# Patient Record
Sex: Female | Born: 1945 | Race: Black or African American | Hispanic: No | Smoking: Never smoker
Health system: Southern US, Community
[De-identification: ages and names within clinical notes are randomized; demographics above are authoritative.]

## PROBLEM LIST (undated history)

## (undated) DIAGNOSIS — E119 Type 2 diabetes mellitus without complications: Secondary | ICD-10-CM

## (undated) DIAGNOSIS — I480 Paroxysmal atrial fibrillation: Secondary | ICD-10-CM

## (undated) DIAGNOSIS — I10 Essential (primary) hypertension: Secondary | ICD-10-CM

## (undated) DIAGNOSIS — D649 Anemia, unspecified: Secondary | ICD-10-CM

## (undated) HISTORY — DX: Paroxysmal atrial fibrillation: I48.0

## (undated) HISTORY — DX: Type 2 diabetes mellitus without complications: E11.9

## (undated) HISTORY — DX: Essential (primary) hypertension: I10

## (undated) HISTORY — DX: Anemia, unspecified: D64.9

---

## 2004-06-14 ENCOUNTER — Ambulatory Visit: Payer: Self-pay | Admitting: Internal Medicine

## 2004-06-23 ENCOUNTER — Ambulatory Visit: Payer: Self-pay | Admitting: Internal Medicine

## 2004-06-29 ENCOUNTER — Inpatient Hospital Stay (HOSPITAL_COMMUNITY): Admission: EM | Admit: 2004-06-29 | Discharge: 2004-07-01 | Payer: Self-pay | Admitting: Emergency Medicine

## 2004-06-29 ENCOUNTER — Encounter (INDEPENDENT_AMBULATORY_CARE_PROVIDER_SITE_OTHER): Payer: Self-pay | Admitting: *Deleted

## 2004-06-30 ENCOUNTER — Ambulatory Visit: Payer: Self-pay | Admitting: Internal Medicine

## 2004-07-12 ENCOUNTER — Ambulatory Visit: Payer: Self-pay | Admitting: Internal Medicine

## 2004-07-28 ENCOUNTER — Encounter (INDEPENDENT_AMBULATORY_CARE_PROVIDER_SITE_OTHER): Payer: Self-pay | Admitting: *Deleted

## 2006-07-04 ENCOUNTER — Emergency Department (HOSPITAL_COMMUNITY): Admission: EM | Admit: 2006-07-04 | Discharge: 2006-07-05 | Payer: Self-pay | Admitting: Emergency Medicine

## 2007-12-19 ENCOUNTER — Inpatient Hospital Stay (HOSPITAL_COMMUNITY): Admission: EM | Admit: 2007-12-19 | Discharge: 2007-12-20 | Payer: Self-pay | Admitting: Emergency Medicine

## 2007-12-21 ENCOUNTER — Encounter: Payer: Self-pay | Admitting: Cardiology

## 2008-02-23 ENCOUNTER — Emergency Department (HOSPITAL_COMMUNITY): Admission: EM | Admit: 2008-02-23 | Discharge: 2008-02-23 | Payer: Self-pay | Admitting: Emergency Medicine

## 2008-03-21 ENCOUNTER — Emergency Department (HOSPITAL_COMMUNITY): Admission: EM | Admit: 2008-03-21 | Discharge: 2008-03-21 | Payer: Self-pay | Admitting: Emergency Medicine

## 2009-02-11 ENCOUNTER — Encounter (INDEPENDENT_AMBULATORY_CARE_PROVIDER_SITE_OTHER): Payer: Self-pay | Admitting: *Deleted

## 2009-03-15 ENCOUNTER — Emergency Department (HOSPITAL_COMMUNITY): Admission: EM | Admit: 2009-03-15 | Discharge: 2009-03-15 | Payer: Self-pay | Admitting: Emergency Medicine

## 2009-03-18 ENCOUNTER — Ambulatory Visit: Payer: Self-pay | Admitting: Internal Medicine

## 2009-03-18 DIAGNOSIS — E119 Type 2 diabetes mellitus without complications: Secondary | ICD-10-CM | POA: Insufficient documentation

## 2009-03-18 DIAGNOSIS — D126 Benign neoplasm of colon, unspecified: Secondary | ICD-10-CM

## 2009-03-18 DIAGNOSIS — R197 Diarrhea, unspecified: Secondary | ICD-10-CM

## 2009-03-18 DIAGNOSIS — D509 Iron deficiency anemia, unspecified: Secondary | ICD-10-CM | POA: Insufficient documentation

## 2009-04-17 ENCOUNTER — Telehealth: Payer: Self-pay | Admitting: Internal Medicine

## 2009-04-28 ENCOUNTER — Ambulatory Visit: Payer: Self-pay | Admitting: Internal Medicine

## 2009-04-29 ENCOUNTER — Encounter: Payer: Self-pay | Admitting: Internal Medicine

## 2009-05-13 ENCOUNTER — Telehealth: Payer: Self-pay | Admitting: Internal Medicine

## 2009-06-08 ENCOUNTER — Ambulatory Visit: Payer: Self-pay | Admitting: Internal Medicine

## 2009-06-08 LAB — CONVERTED CEMR LAB
Basophils Relative: 0.3 % (ref 0.0–3.0)
Eosinophils Absolute: 0.2 10*3/uL (ref 0.0–0.7)
Eosinophils Relative: 3.2 % (ref 0.0–5.0)
HCT: 30.3 % — ABNORMAL LOW (ref 36.0–46.0)
Lymphs Abs: 2 10*3/uL (ref 0.7–4.0)
MCHC: 31.6 g/dL (ref 30.0–36.0)
MCV: 81.8 fL (ref 78.0–100.0)
Monocytes Absolute: 0.7 10*3/uL (ref 0.1–1.0)
Neutro Abs: 3.2 10*3/uL (ref 1.4–7.7)
RBC: 3.7 M/uL — ABNORMAL LOW (ref 3.87–5.11)
WBC: 6.1 10*3/uL (ref 4.5–10.5)

## 2009-06-10 ENCOUNTER — Ambulatory Visit: Payer: Self-pay | Admitting: Internal Medicine

## 2010-02-18 ENCOUNTER — Encounter (HOSPITAL_COMMUNITY)
Admission: RE | Admit: 2010-02-18 | Discharge: 2010-04-10 | Payer: Self-pay | Source: Home / Self Care | Attending: Endocrinology | Admitting: Endocrinology

## 2010-04-09 ENCOUNTER — Encounter: Payer: Self-pay | Admitting: Internal Medicine

## 2010-05-11 NOTE — Letter (Signed)
Summary: Patient Notice- Polyp Results  [facility]  [address] [place], [place] [address]   Phone: [phone]  Fax: [phone]        April 29, 2009 MRN: [id number]    Kristi Gonzalez [address] [place], [place]  [address]    Dear [name],  I am pleased to inform you that the colon polyp(s) removed during your recent colonoscopy was (were) found to be benign (no cancer detected) upon pathologic examination. The polyp was however precancerous.  I recommend you have a repeat colonoscopy examination in one years to look for recurrent polyps, as having colon polyps increases your risk for having recurrent polyps or even colon cancer in the future.  Should you develop new or worsening symptoms of abdominal pain, bowel habit changes or bleeding from the rectum or bowels, please schedule an evaluation with either your primary care physician or with me.  Additional information/recommendations:  __ No further action with gastroenterology is needed at this time. Please      follow-up with your primary care physician for your other healthcare      needs.   Please call us if you are having persistent problems or have questions about your condition that have not been fully answered at this time.  Sincerely,  Hilarie Fredrickson MD  This letter has been electronically signed by your physician.  Appended Document: Patient Notice- Polyp Results Letter mailed 1.21.11

## 2010-05-11 NOTE — Assessment & Plan Note (Signed)
Summary: ECLFollowup - anemia   History of Present Illness Visit Type: Follow-up Visit Primary GI MD: Yancey Flemings MD Primary Provider: Assunta Curtis, MD Requesting Provider: Assunta Curtis, MD Chief Complaint: F/U Colonoscopy and EGD, no problems History of Present Illness:   65 year old African American female with hypertension, diabetes mellitus, coronary artery disease, adenomatous colon polyps, and iron deficiency anemia. She was evaluated in the office March 18, 2009 regarding iron deficiency anemia. See that dictation. Hemoglobin levels at that time were 9.3 with an iron saturation of 12.9% and ferritin level of 33.4. She had been on iron therapy and has continued on iron therapy since that time. She did undergo colonoscopy and upper endoscopy on April 28, 2009. 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 As well scattered diverticulosis. Followup colonoscopy in one year recommended. Upper endoscopy was normal. She has continued on iron twice daily. Blood work from June 08, 2009 reveals very little change in hemoglobin, now 9.6. MCV is 81.8. White blood cell count and platelets are normal. She denies any GI complaints. She denies fatigue or exertional dyspnea.. She is now on iron 3 times daily.   GI Review of Systems      Denies abdominal pain, acid reflux, belching, bloating, chest pain, dysphagia with liquids, dysphagia with solids, heartburn, loss of appetite, nausea, vomiting, vomiting blood, weight loss, and  weight gain.        Denies anal fissure, black tarry stools, change in bowel habit, constipation, diarrhea, diverticulosis, fecal incontinence, heme positive stool, hemorrhoids, irritable bowel syndrome, jaundice, light color stool, liver problems, rectal bleeding, and  rectal pain.    Current Medications (verified): 1)  Metformin Hcl 500 Mg Tabs (Metformin Hcl) .Marland Kitchen.. 1 Tablet By Mouth Two Times A Day 2)   Aspirin 81 Mg Tbec (Aspirin) .Marland Kitchen.. 1 Tablet By Mouth Once Daily 3)  Glipizide 10 Mg Xr24h-Tab (Glipizide) .Marland Kitchen.. 1 Tablet By Mouth Once Daily 4)  Benazepril-Hydrochlorothiazide 20-12.5 Mg Tabs (Benazepril-Hydrochlorothiazide) .... One Tablet By Mouth Once Daily 5)  Digoxin 0.125 Mg Tabs (Digoxin) .... One Tablet By Mouth Once Daily 6)  Simvastatin 40 Mg Tabs (Simvastatin) .... One Tablet By Mouth Once Daily 7)  Folic Acid 400 Mcg Tabs (Folic Acid) .... One Tablet By Mouth Once Daily 8)  Nu-Iron 150 Mg Caps (Polysaccharide Iron Complex) .... One Tablet By Mouth Once Daily 9)  Metoprolol Succinate 25 Mg Xr24h-Tab (Metoprolol Succinate) .... One Tablet By Mouth Once Daily 10)  Lantus 100 Unit/ml Soln (Insulin Glargine) .Marland Kitchen.. 10 Units Subcutaneously Q Hs  Allergies (verified): 1)  ! Codeine  Past History:  Past Medical History: Reviewed history from 03/17/2009 and no changes required. Hypertension Diabetes Anemia Coronary Artery Disease Colon Polyps Tubular Adenoma  Past Surgical History: Reviewed history from 03/18/2009 and no changes required. Tubal Ligation  Family History: Reviewed history from 03/18/2009 and no changes required. No FH of Colon Cancer: Family History of Prostate Cancer:Father Family History of Colon Polyps:Mother Family History of Diabetes: Mother and Father Family History of Heart Disease: Father  Social History: Reviewed history from 03/18/2009 and no changes required. Occupation: Retired Widowed 4 childern  Patient has never smoked.  Alcohol Use - no Daily Caffeine Use: soda occ  Illicit Drug Use - no  Review of Systems       The patient complains of anemia.  The patient denies allergy/sinus, anxiety-new, arthritis/joint pain, back pain, blood in urine, breast changes/lumps, change in vision,  confusion, cough, coughing up blood, depression-new, fainting, fatigue, fever, headaches-new, hearing problems, heart murmur, heart rhythm changes, itching,  menstrual pain, muscle pains/cramps, night sweats, nosebleeds, pregnancy symptoms, shortness of breath, skin rash, sleeping problems, sore throat, swelling of feet/legs, swollen lymph glands, thirst - excessive , urination - excessive , urination changes/pain, urine leakage, vision changes, and voice change.    Vital Signs:  Patient profile:   65 year old female Height:      63 inches Weight:      146.13 pounds BMI:     25.98 Pulse rate:   80 / minute Pulse rhythm:   regular BP sitting:   120 / 62  (left arm) Cuff size:   regular  Vitals Entered By: June McMurray CMA Duncan Dull) (June 10, 2009 1:33 PM)  Physical Exam  General:  Well developed, well nourished, no acute distress. Mouth:  No deformity or lesions,  Abdomen:  not reexamined Neurologic:  Alert and  oriented x4;   Psych:  Alert and cooperative. Normal mood and affect.   Impression & Recommendations:  Problem # 1:  ANEMIA, IRON DEFICIENCY, CHRONIC (ICD-280.9) Anemia. Possibly iron deficient. No response to oral iron therapy after several months. Question other cause her causes for anemia. No significant GI mucosal pathology.  Plan: #1. Recommend hematology consultation. The patient declines at this time. She has agreed to stay on iron therapy. She should have a followup CBC at her next appointment with Dr. Evlyn Kanner in a few months. At that time, if she is still anemic, she should be referred to hematology. Dr. Evlyn Kanner can make this referral if the patient is agreeable at that time. She tells me that she would be agreeable.  Problem # 2:  ADENOMATOUS COLONIC POLYP (ICD-211.3) large villous adenoma of the cecum. Removed piecemeal. . Needs followup colonoscopy in one year. She is aware  Patient Instructions: 1)  Please schedule a follow-up appointment as needed.  2)  The medication list was reviewed and reconciled.  All changed / newly prescribed medications were explained.  A complete medication list was provided to the patient /  caregiver. 3)  Copy: Dr. Adrian Prince Patient instructions given to patient prior to leaving.  Milford Cage [facility]  June 10, 2009 1:57 PM

## 2010-05-11 NOTE — Initial Assessments (Signed)
Summary: Consultation   NAME:  Kristi Gonzalez, Kristi Gonzalez               ACCOUNT NO.:  [id number]   MEDICAL RECORD NO.:  [id number]          PATIENT TYPE:  EMS   LOCATION:  ED                           FACILITY:  [facility]   PHYSICIAN:  Iva Boop, M.D. LHCDATE OF BIRTH:  10/21/1945   DATE OF CONSULTATION:  DATE OF DISCHARGE:                                   CONSULTATION   CHIEF COMPLAINT:  Bleeding after colonoscopy.   HISTORY:  This is a pleasant 65 year old African-American woman with a  history of hypertension and diabetes mellitus. She underwent colonoscopy and  EGD by Dr. Yancey Flemings on March 15 because of anemia. She was referred to him  because of a hemoglobin of 10.9 with an MCV of 80 and iron saturation of 10%  with similar numbers in August of 2005. She had no bleeding problems but she  had some mild intermittent constipation and diarrhea. She had a cecal polyp  removed, this was an 11 mm cecal polyp that was sessile removed piecemeal.  She had some erosions in the left colon biopsied and diverticulosis in the  sigmoid. She had an EGD with a small hiatal hernia, duodenal biopsies were  taken to rule out sprue. At about 8:30 on March 20, just last night, she  developed onset of mild cramps followed by hematochezia with bloody stools.  She has had 5 or 6 or those the most recent of which was in the emergency  room with decreased amount she says.  Dr. Lynelle Doctor called me about the patient  and said that she had maroon stool on rectal exam. She denies abdominal  pain, vomiting, presyncope or chest pain at this point.   REVIEW OF SYMPTOMS:  Otherwise negative at this point from what I can tell.  There was no respiratory difficulty or shortness of breath.   PAST MEDICAL HISTORY:  As above plus bilateral tubal ligation.   ALLERGIES:  No known drug allergies.   MEDICATIONS AT HOME:  1.  Iron 65 mg q.d.  2.  Folic acid daily 1 mg.  3.  Avandia 4 mg twice a day.  4.  Benazepril/HCTZ 20/12.5  mg q.d.  5.  Metformin 500 mg b.i.d.  6.  Glipizide 10 mg each day.  7.  Aspirin 81 mg each day is on hold per our protocol.   FAMILY HISTORY:  Mother had colon polyps, there is no colon cancer.   SOCIAL HISTORY:  She is widowed with four daughters. 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 She also works at [employer]. She does not smoke or use alcohol, here with two of her daughters  now.   ADDITIONAL REVIEW OF SYSTEMS:  No history of blood donation, stopped menses  at age 42, all other systems appear negative.   PHYSICAL EXAMINATION:  GENERAL:  Reveals a well appearing, overweight, obese  black woman in no acute distress.  VITAL SIGNS:  Blood pressure is 149/79, pulse 97, temperature 98.1,  respirations 20.  HEENT:  Eyes anicteric.  NECK:  Supple.  CHEST:  Clear.  HEART:  S1, S1, no rubs or gallops.  ABDOMEN:  Soft, nontender, no organomegaly. Bowel sounds mildly increased  perhaps.  RECTAL:  Not repeated.  EXTREMITIES:  Without edema.  NEUROLOGIC:  She is alert and oriented x3.  SKIN:  In areas inspected free of rash.   LABORATORY DATA:  Her hemoglobin this morning is 10.5 with hematocrit 32.8%,  MCV is 80.2, platelet count 300.  White count 7.4, PTT is prolonged at 45  seconds with top normal 37. Her pro time/INR is normal at 0.9 with a pro  time of 12.4 seconds. CMET is normal except for glucose of 211.   ASSESSMENT:  1.  Gastrointestinal bleeding after polypectomy. Possibility of diverticular      bleed exists but most likely she is having bleeding from the cecal      polypectomy site.  2.  Diabetes mellitus not controlled at this point with blood sugar 211.  3.  Hypertension.  4.  Prolonged PTT, uncertain etiology, probably nonspecific and laboratory      error would be my best guess but she could potentially have some sort of      undiagnosed coagulopathy.   PLAN:  1.  Admit the patient, hydrate her, transfuse if needed. The  risks are      reviewed with the patient and the daughter that include transfusion      reaction, hepatitis and AIDS.  Recheck PTT, might need a mixing study.  2.  If she has persistent bleeding, a colonoscopy might be indicated.  3.  Hold her antihypertensives but give oral diabetic agents and check blood      sugars. May need sliding scale. Since she is going to be on clear      liquids, I would not give her the sliding scale at this point but we      will see what her blood sugars do and add that during the day if needed.      May need sliding coverage as well, will order that in case she needs it.   I appreciate the opportunity to care for this patient.      CEG/MEDQ  D:  06/29/2004  T:  06/29/2004  Job:  [id number]   cc:   Wilhemina Bonito. Marina Goodell, M.D. [facility]   Jeannett Senior A. Evlyn Kanner, M.D.  [address]  [place]  [place] [address]  Fax: [phone]

## 2010-05-11 NOTE — Procedures (Signed)
Summary: Upper Endoscopy  Patient: Kristi Gonzalez Note: All result statuses are Final unless otherwise noted.  Tests: (1) Upper Endoscopy (EGD)   EGD Upper Endoscopy       DONE     [facility]     [address]     [place], [place]  [address]           ENDOSCOPY PROCEDURE REPORT           PATIENT:  Kristi Gonzalez, Kristi Gonzalez  MR#:  [id number]     BIRTHDATE:  1945-11-08, 63 yrs. old  GENDER:  female           ENDOSCOPIST:  Wilhemina Bonito. Eda Keys, MD     Referred by:  Adrian Prince, M.D.           PROCEDURE DATE:  04/28/2009     PROCEDURE:  EGD, diagnostic     ASA CLASS:  Class II     INDICATIONS:  iron deficiency anemia           MEDICATIONS:   There was residual sedation effect present from     prior procedure., Versed 2 mg IV     TOPICAL ANESTHETIC:  Exactacain Spray           DESCRIPTION OF PROCEDURE:   After the risks benefits and     alternatives of the procedure were thoroughly explained, informed     consent was obtained.  The LB GIF-H180 [id number] endoscope was     introduced through the mouth and advanced to the third portion of     the duodenum, without limitations.  The instrument was slowly     withdrawn as the mucosa was fully examined.     <<PROCEDUREIMAGES>>           The upper, middle, and distal third of the esophagus were     carefully inspected and no abnormalities were noted. The z-line     was well seen at the GEJ. The endoscope was pushed into the fundus     which was normal including a retroflexed view. The antrum,gastric     body, first and second part of the duodenum were unremarkable.     Retroflexed views revealed no abnormalities.    The scope was then     withdrawn from the patient and the procedure completed.           COMPLICATIONS:  None           ENDOSCOPIC IMPRESSION:     1) Normal EGD     RECOMMENDATIONS:     1) continue iron DAILY     2) Call office next 2-3 days to schedule an office appointment     for 8 weeks     3) Have a CBC (blood  count) drawn a few days prior to your office     visit           ______________________________     Wilhemina Bonito. Eda Keys, MD           CC:  Adrian Prince, MD, The Patient           n.     eSIGNED:   Wilhemina Bonito. Eda Keys at 04/28/2009 11:45 AM           Tobie Lords, [id number]  Note: An exclamation mark (!) indicates a result that was not dispersed into the flowsheet. Document Creation Date: 04/28/2009 11:45 AM _______________________________________________________________________  (1) Order result status: Final Collection  or observation date-time: 04/28/2009 11:39 Requested date-time:  Receipt date-time:  Reported date-time:  Referring Physician:   Ordering Physician: Fransico Setters [phone]) Specimen Source:  Source: Launa Grill Order Number: [id number] Lab site:

## 2010-05-11 NOTE — Procedures (Signed)
Summary: Colonoscopy  Patient: Kristi Gonzalez Note: All result statuses are Final unless otherwise noted.  Tests: (1) Colonoscopy (COL)   COL Colonoscopy           DONE     [facility]     [address]     [place], [place]  [address]           COLONOSCOPY PROCEDURE REPORT           PATIENT:  Kristi, Gonzalez  MR#:  [id number]     BIRTHDATE:  19-Apr-1945, 63 yrs. old  GENDER:  female           ENDOSCOPIST:  Wilhemina Bonito. Eda Keys, MD     Referred by:  Adrian Prince, M.D.           PROCEDURE DATE:  04/28/2009     PROCEDURE:  Colonoscopy with snare polypectomy     ASA CLASS:  Class II     INDICATIONS:  Iron Deficiency Anemia ; overdue for surveillance           MEDICATIONS:   Fentanyl 75 mcg IV, Versed 7 mg IV           DESCRIPTION OF PROCEDURE:   After the risks benefits and     alternatives of the procedure were thoroughly explained, informed     consent was obtained.  Digital rectal exam was performed and     revealed no abnormalities.   The LB CF-H180AL [id number] endoscope     was introduced through the anus and advanced to the cecum, which     was identified by both the appendix and ileocecal valve, without     limitations. Time to cecum = 3:11 min.The quality of the prep was     good, using MoviPrep.  The instrument was then slowly withdrawn     (t=20:40min) as the colon was fully examined.     <<PROCEDUREIMAGES>>           FINDINGS:  A29mm sessile polyp was found in the cecum adjacent to     the appendiceal orifice. Polyp was snared piecemeal, then     cauterized with monopolar cautery. Retrieval was successful.     Mild diverticulosis was found found scattered throught the colon.     Retroflexed views in the rectum revealed no abnormalities.    The     scope was then withdrawn from the patient and the procedure     completed.           COMPLICATIONS:  None           ENDOSCOPIC IMPRESSION:     1) Sessile polyp in the cecum (from prior polypectomy site) -     removed  piecemeal     2) Mild diverticulosis found scattered throught the colon     RECOMMENDATIONS:     1) Follow up colonoscopy in 1 year     2) EGD today           ______________________________     Wilhemina Bonito. Eda Keys, MD           CC:  Adrian Prince, MD; The Patient           n.     eSIGNED:   Wilhemina Bonito. Eda Keys at 04/28/2009 11:34 AM           Tobie Lords, [id number]  Note: An exclamation mark (!) indicates a result that was not dispersed into the flowsheet. Document  Creation Date: 04/28/2009 11:34 AM _______________________________________________________________________  (1) Order result status: Final Collection or observation date-time: 04/28/2009 11:23 Requested date-time:  Receipt date-time:  Reported date-time:  Referring Physician:   Ordering Physician: Fransico Setters [phone]) Specimen Source:  Source: Launa Grill Order Number: [id number] Lab site:   Appended Document: Colonoscopy     Procedures Next Due Date:    Colonoscopy: 04/2010

## 2010-05-11 NOTE — Discharge Summary (Signed)
Summary: Discharge Summary   NAME:  Kristi Gonzalez, Kristi Gonzalez               ACCOUNT NO.:  [id number]   MEDICAL RECORD NO.:  [id number]          PATIENT TYPE:  INP   LOCATION:  0452                         FACILITY:  [facility]   PHYSICIAN:  Wilhemina Bonito. Marina Goodell, M.D. [facility] OF BIRTH:  07/11/1945   DATE OF ADMISSION:  06/29/2004  DATE OF DISCHARGE:  07/01/2004                                 DISCHARGE SUMMARY   ADMITTING DIAGNOSES:  1.  Acute lower gastrointestinal bleed, post polypectomy consistent with      post polypectomy hemorrhage.  2.  Diabetes mellitus.  3.  Hypertension.  4.  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   DISCHARGE DIAGNOSES:  1.  Resolved post polypectomy bleed.  2.  Anemia secondary to above.  3.  Diabetes mellitus.  4.  Hypertension.   CONSULTATIONS:  None.   PROCEDURES:  None.   BRIEF HISTORY:  Kristi Gonzalez is a 65 year old [place] American female with history  as described above.  She had undergone a colonoscopy and upper endoscopy  with Dr. Marina Goodell, on June 23, 2004, due to a finding of anemia.  Her  hemoglobin at the time of referral was 10.9 with an MCV of 80 and an iron  saturation of 10%.  She had no obvious bleeding problems but had had some  intermittent constipation and diarrhea.  She did have a cecal polyp removed,  11-mm, at the time of colonoscopy and this was removed piecemeal.  She was  also noted to have some erosions in the left colon which were biopsied and  sigmoid diverticulosis.  EGD showed only a hiatal hernia and random duodenal  biopsies were taken to rule out sprue.  At about 8:30 in the evening on  June 28, 2004, she developed onset of abdominal cramping, followed by  hematochezia with grossly bloody stools.  She had 5-6 episodes at home and  then presented to the emergency room, was seen and evaluated by the ER  physician, GI was then called and she is admitted at this time with a post  polypectomy bleed.  She was hemodynamically stable on admission with  hemoglobin of 10.5.   LABORATORY STUDIES:  On June 29, 2004, again WBC was 7.4, hemoglobin 10.5,  hematocrit of 32.8, MCV of 80.  Serial values were obtained.  Later that  same day, hemoglobin 9, hematocrit of 27.9.  Followup on June 30, 2004,  hemoglobin 9.1, hematocrit of 28.5, and on July 01, 2004, hemoglobin 10.1,  hematocrit of 31.2.  Pro time 12.4, INR of 0.9, PTT of 45.  This was  repeated and was still elevated at 40.  Electrolytes within normal limits.  BUN 10, creatinine 0.6, glucose was 211 on admission.  Liver functions  normal.   HOSPITAL COURSE:  The patient was admitted by Dr. Leone Payor who was covering  the GI service.  She was placed at bedrest on a clear liquid diet, started  on IV fluids, and serial H&H's were obtained.  On the morning of June 29, 2004, she had no complaints of abdominal pain, was still having some  bleeding, but the  interval between episodes was increasing.  We continued to  watch her closely.  Her hemoglobin stabilized and she did not require  transfusion.  By June 30, 2004, her bleeding had completely stopped.  She  had not had any further stools for over 12 hours and hemoglobin was 9.  She  was placed on a full liquid diet, ambulated a bit, and on July 01, 2004,  continued to do well with a stable hemoglobin at 9.1 and no further stools.  It was felt that her bleeding had resolved.  She was allowed discharge to  home in a stable and improved condition to follow up with Dr. Marina Goodell on July 12, 2004 at 11:00 a.m. and to call for any problems in the interim.   MEDICATIONS ON DISCHARGE:  1.  She was to hold her aspirin until July 19, 2004.  Use on aspirin or      anti-inflammatories.  2.  Resume her other medications including iron 65 mg daily.  3.  Folic acid 1 mg daily.  4.  Avandia 4 mg b.i.d.  5.  Benazepril/HCTZ 20/12.5 daily.  6.  Metformin 500 b.i.d.  7.  Glipizide 10 mg daily.   DIET:  Low residue x 2 weeks.   CONDITION ON  DISCHARGE:  Stable and improved.      AE/MEDQ  D:  07/28/2004  T:  07/28/2004  Job:  [id number]   cc:   Wilhemina Bonito. Marina Goodell, M.D. [facility]   Jeannett Senior A. Evlyn Kanner, M.D.  [address]  [place]  [place] [address]  Fax: [phone]

## 2010-05-11 NOTE — Progress Notes (Signed)
Summary: Was recently put on insulin-sched for [facility]  Phone Note Call from Patient Call back at Home Phone [phone]   Call For: Dr Marina Goodell Summary of Call: Appt on the 18th for [facility] and has been recently put on insulin. Initial call taken by: Leanor Kail [facility],  April 17, 2009 12:22 PM  Follow-up for Phone Call        Since the patient's office visit she has been started on Lantus Insulin 10 u daily.  Dr Marina Goodell are you ok with the patient proceeding with her endo/colon as scheduled on 04/28/09.  Patient 's oral diabetes meds are unchanged.  Dr Marina Goodell please advise. Follow-up by: Darcey Nora RN, CGRN,  April 17, 2009 1:23 PM  Additional Follow-up for Phone Call Additional follow up Details #1::        When does she take the Lantus? If at night, give 1/2 dose. If in am, hold the insulin until after the procedures. Thanks Additional Follow-up by: Hilarie Fredrickson MD,  April 17, 2009 2:51 PM    Additional Follow-up for Phone Call Additional follow up Details #2::    Patient  states she takes her lantus in the am, she is instructed to hold it on 04/28/09 Follow-up by: Darcey Nora RN, CGRN,  April 17, 2009 3:21 PM  New/Updated Medications: LANTUS 100 UNIT/ML SOLN (INSULIN GLARGINE) 10 units subcutaneously q hs

## 2010-05-11 NOTE — Progress Notes (Signed)
Summary: labwork?  Phone Note Call from Patient Call back at [facility] Phone [phone]   Caller: Patient Call For: Dr. Marina Goodell Reason for Call: Talk to Nurse Summary of Call: pt sch'ed 3 mo f/u from [facility] and thinks she is also to have labwork before this follow up...  nothing in IDX Initial call taken by: Vallarie Mare,  May 13, 2009 3:24 PM  Follow-up for Phone Call        Pt. ntfd. that I put labs  in the computer for 1-2 days prior to appt. on 06/10/09. Follow-up by: Teryl Lucy RN,  May 14, 2009 9:26 AM

## 2010-05-13 NOTE — Letter (Signed)
Summary: Colonoscopy Letter  [facility]  [address] [place], [place] [address]   Phone: [phone]  Fax: [phone]      April 09, 2010 MRN: [id number]   Kristi Gonzalez [address] [place], [place]  [address]   Dear [name],   According to your medical record, it is time for you to schedule a Colonoscopy. The American Cancer Society recommends this procedure as a method to detect early colon cancer. Patients with a family history of colon cancer, or a personal history of colon polyps or inflammatory bowel disease are at increased risk.  This letter has been generated based on the recommendations made at the time of your procedure. If you feel that in your particular situation this may no longer apply, please contact our office.  Please call our office at [phone] to schedule this appointment or to update your records at your earliest convenience.  Thank you for cooperating with [place] to provide you with the very best care possible.   Sincerely,  Wilhemina Bonito. Marina Goodell, M.D.  [facility] [phone]

## 2010-06-27 LAB — GLUCOSE, CAPILLARY: Glucose-Capillary: 87 mg/dL (ref 70–99)

## 2010-07-13 LAB — CBC
HCT: 31.3 % — ABNORMAL LOW (ref 36.0–46.0)
Hemoglobin: 10 g/dL — ABNORMAL LOW (ref 12.0–15.0)
RBC: 3.86 MIL/uL — ABNORMAL LOW (ref 3.87–5.11)
RDW: 14.5 % (ref 11.5–15.5)
WBC: 6.5 10*3/uL (ref 4.0–10.5)

## 2010-07-13 LAB — COMPREHENSIVE METABOLIC PANEL
ALT: 14 U/L (ref 0–35)
Alkaline Phosphatase: 46 U/L (ref 39–117)
Chloride: 100 mEq/L (ref 96–112)
Glucose, Bld: 95 mg/dL (ref 70–99)
Potassium: 3.7 mEq/L (ref 3.5–5.1)
Sodium: 135 mEq/L (ref 135–145)
Total Bilirubin: 0.4 mg/dL (ref 0.3–1.2)
Total Protein: 7.7 g/dL (ref 6.0–8.3)

## 2010-07-13 LAB — DIFFERENTIAL
Basophils Absolute: 0 10*3/uL (ref 0.0–0.1)
Basophils Relative: 1 % (ref 0–1)
Eosinophils Absolute: 0.1 10*3/uL (ref 0.0–0.7)
Monocytes Absolute: 0.7 10*3/uL (ref 0.1–1.0)
Monocytes Relative: 11 % (ref 3–12)
Neutrophils Relative %: 55 % (ref 43–77)

## 2010-07-13 LAB — GLUCOSE, CAPILLARY: Glucose-Capillary: 84 mg/dL (ref 70–99)

## 2010-08-24 NOTE — H&P (Signed)
NAME:  Kristi Gonzalez, Kristi Gonzalez               ACCOUNT NO.:  [id number]   MEDICAL RECORD NO.:  [id number]          PATIENT TYPE:  INP   LOCATION:  3736                         FACILITY:  [facility]   PHYSICIAN:  Cassell Clement, M.D. DATE OF BIRTH:  1946/02/03   DATE OF ADMISSION:  12/19/2007  DATE OF DISCHARGE:                              HISTORY & PHYSICAL   CHIEF COMPLAINT:  Rapid irregular heart rate.   HISTORY:  This is a 65 year old African American woman, who developed  sudden rapid atrial fibrillation this morning while working.  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 when she  suddenly felt weak and felt her heart racing in her chest and had near  syncope.  She was also dyspneic, but denied any chest pain.  She does  not have any history of coronary artery disease.  She has had diabetes  for more than 10 years and hypertension for more than 10 years.  She had  a negative stress test in 2001.  She has not had an echo as far as she  knows.  She does not have any history of thyroid problems.  She has had  a past history of chronic iron-deficiency anemia and has been on iron  which was recently adjusted by Dr. Evlyn Kanner.  In June 23, 2004, she  underwent esophagogastroduodenoscopy and colonoscopy by Dr. Yancey Flemings  with removal of a sessile polyp in the cecum at that time.   HOME MEDICATIONS:  1. Simvastatin 40 mg daily.  2. Metformin 1000 mg half in the morning, one in the evening.  3. Glipizide 10 mg daily.  4. Avandia 8 mg daily.  5. Ferrex 150 mg cap daily.  6. Benazepril/hydrochlorothiazide 20/12.5 daily.  7. Senokot p.r.n.Marland Kitchen   FAMILY HISTORY:  Mother is still living at age 2 and has diabetes and  hypertension.  Father died at 87 of heart problems.   SOCIAL HISTORY:  Reveals that she is a widow.  She works two jobs.  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  She  does  not use alcohol or tobacco.   ALLERGIES:  She is allergic to CODEINE.   PREVIOUS SURGERY:  Tubal ligation.   REVIEW OF SYSTEMS:  GI:  Reveals that she has had dark stools  attributable to iron.  She has not had any gross hematochezia.  She  denies any dysuria and she has nocturia x3.  She denies cough or sputum  production.  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  Remainder  of review of systems negative in detail.   PHYSICAL EXAMINATION:  VITAL SIGNS:  Her blood pressure is 130/70, pulse  is now 74 and is normal sinus rhythm.  In the emergency room, she was  found to be in atrial fib with a rate of 130 and had been placed on IV  Cardizem drip and when she arrived on 3700, she was found to be in sinus  rhythm.  HEENT:  Unremarkable.  Chest:  Clear.  HEART:  No murmur, gallop, rub, or click.  ABDOMEN:  Soft and nontender.  Liver and spleen not enlarged.  EXTREMITIES:  No phlebitis.  No edema.  Pedal pulses present.   Her initial EKG is not available for review, but it is the one that  showed atrial fib.  The EKG available for review is one done on 3700  showing normal sinus rhythm and no ischemic changes.  Her chest x-ray  shows borderline cardiomegaly in a portable film, but no overt CHF.  Her  B-type natriuretic peptide is normal at 35, hemoglobin is 9.7,  hematocrit 30.7.  Electrolytes normal with a BUN of 14, creatinine of  0.8.   IMPRESSION:  1. Paroxysmal atrial fibrillation resolved after brief IV Cardizem      therapy.  2. Anemia, chronic by history.  3. Mild cardiomegaly probably secondary to her anemia and her history      of hypertension.  4. Diabetes mellitus type 2.   DISPOSITION:  The patient has been admitted to 3700.  We have stopped  her IV Cardizem.  We are going to start low-dose beta-blocker and 81 mg  aspirin.  Anticipate possible discharge tomorrow morning if she remains  stable overnight and then we can get her two-dimensional  echocardiogram  as an outpatient.   Thank you for the opportunity for sharing in this pleasant woman's care.           ______________________________  Cassell Clement, M.D.     TB/MEDQ  D:  12/19/2007  T:  12/20/2007  Job:  [id number]   cc:   Jeannett Senior A. Evlyn Kanner, M.D.  Wilhemina Bonito. Marina Goodell, MD

## 2010-08-24 NOTE — Discharge Summary (Signed)
NAME:  Kristi Gonzalez, Kristi Gonzalez               ACCOUNT NO.:  [id number]   MEDICAL RECORD NO.:  [id number]          PATIENT TYPE:  INP   LOCATION:  3736                         FACILITY:  [facility]   PHYSICIAN:  Cassell Clement, M.D. DATE OF BIRTH:  [birth year]   DATE OF ADMISSION:  12/19/2007  DATE OF DISCHARGE:  12/20/2007                               DISCHARGE SUMMARY   FINAL DIAGNOSES:  1. Paroxysmal atrial fibrillation, resolved.  2. Diabetes mellitus.  3. Iron-deficiency anemia.  4. Mild cardiomegaly on portable x-ray.   OPERATIONS PERFORMED:  None.   HISTORY:  This is a 65 year old African American woman who developed  sudden rapid atrial fib while working at the [employer] where she  works in [employer].  She nearly fainted and was aware of rapid  irregular palpitations.  She had syncope, but no chest pain.  She has a  past history of longstanding diabetes and hypertension, but has not had  known coronary disease and had negative stress test in 2001.  She has  not had a prior echo.  She does not have any history of known thyroid  problems.  She has had a history of iron-deficiency anemia.  She did  have upper endoscopy and colonoscopy by Dr. Marina Goodell, on June 23, 2004,  with removal of sessile polyp at that time.   PHYSICAL EXAMINATION:  VITAL SIGNS:  Her weight was 130/70, pulse when  she came to the emergency room was rapid and irregular and she was  placed on IV Cardizem drip.  When she arrived on 3700 floor, she had  converted back to normal sinus rhythm at 74/min and respirations are  normal.  HEENT:  Negative.  CHEST:  Clear.  HEART:  No murmur, gallop, or rub.  ABDOMEN:  Negative.  EXTREMITIES:  No phlebitis or edema.   Her EKG after conversion to sinus rhythm shows no acute changes.  Chest  x-ray shows mild cardiomegaly with no CHF.   LABORATORY DATA:  Initially showed hemoglobin 9.7, the following day was  up to 10.1.  Electrolytes were normal.  B-natriuretic peptide  normal at  35.  Point-of-care cardiac enzymes were normal.   HOSPITAL COURSE:  The patient was taken off her IV Cardizem after  arrival on 3700.  When it was seen that she was back in sinus rhythm,  she was begun on low-dose beta-blocker and 25 mg of Toprol daily, and  was continued on daily aspirin.  She remained in sinus rhythm overnight  and is being discharged the following morning.  Echocardiogram could not  be done on admission in a timely fashion and so we will do that as an  outpatient.   DISCHARGE MEDICATIONS:  1. Toprol-XL 25 one daily.  2. Aspirin 81 mg daily.  3. Simvastatin 40 mg daily.  4. Metformin 1000 mg tablets taking half in the morning, whole at      night.  5. Glipizide 10 mg half tablet daily.  6. Avandia 8 mg half tablet daily.  7. Ferrex 150 one twice a day.  8. Benazepril and hydrochlorothiazide 20/12.5 daily.   She  will remain on diabetic diet.  She is to try to lose weight.  She is  to avoid caffeine.  We will arrange for an outpatient echo in our  office.  Anticipate she will return to work on December 24, 2007, and  we gave her a note to that effect.   CONDITION ON DISCHARGE:  Improved.           ______________________________  Cassell Clement, M.D.     TB/MEDQ  D:  12/20/2007  T:  12/20/2007  Job:  [id number]   cc:   Jeannett Senior A. Evlyn Kanner, M.D.

## 2010-08-27 NOTE — Discharge Summary (Signed)
NAME:  Kristi Gonzalez, Kristi Gonzalez               ACCOUNT NO.:  [id number]   MEDICAL RECORD NO.:  [id number]          PATIENT TYPE:  INP   LOCATION:  0452                         FACILITY:  [facility]   PHYSICIAN:  Wilhemina Bonito. Marina Goodell, M.D. [facility] OF BIRTH:  November 12, 1945   DATE OF ADMISSION:  06/29/2004  DATE OF DISCHARGE:  07/01/2004                                 DISCHARGE SUMMARY   ADMITTING DIAGNOSES:  1.  Acute lower gastrointestinal bleed, post polypectomy consistent with      post polypectomy hemorrhage.  2.  Diabetes mellitus.  3.  Hypertension.  4.  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   DISCHARGE DIAGNOSES:  1.  Resolved post polypectomy bleed.  2.  Anemia secondary to above.  3.  Diabetes mellitus.  4.  Hypertension.   CONSULTATIONS:  None.   PROCEDURES:  None.   BRIEF HISTORY:  Kristi Gonzalez is a 65 year old [place] American female with history  as described above.  She had undergone a colonoscopy and upper endoscopy  with Dr. Marina Goodell, on June 23, 2004, due to a finding of anemia.  Her  hemoglobin at the time of referral was 10.9 with an MCV of 80 and an iron  saturation of 10%.  She had no obvious bleeding problems but had had some  intermittent constipation and diarrhea.  She did have a cecal polyp removed,  11-mm, at the time of colonoscopy and this was removed piecemeal.  She was  also noted to have some erosions in the left colon which were biopsied and  sigmoid diverticulosis.  EGD showed only a hiatal hernia and random duodenal  biopsies were taken to rule out sprue.  At about 8:30 in the evening on  June 28, 2004, she developed onset of abdominal cramping, followed by  hematochezia with grossly bloody stools.  She had 5-6 episodes at home and  then presented to the emergency room, was seen and evaluated by the ER  physician, GI was then called and she is admitted at this time with a post  polypectomy bleed.  She was hemodynamically stable on admission with  hemoglobin of 10.5.   LABORATORY STUDIES:  On June 29, 2004, again WBC was 7.4, hemoglobin 10.5,  hematocrit of 32.8, MCV of 80.  Serial values were obtained.  Later that  same day, hemoglobin 9, hematocrit of 27.9.  Followup on June 30, 2004,  hemoglobin 9.1, hematocrit of 28.5, and on July 01, 2004, hemoglobin 10.1,  hematocrit of 31.2.  Pro time 12.4, INR of 0.9, PTT of 45.  This was  repeated and was still elevated at 40.  Electrolytes within normal limits.  BUN 10, creatinine 0.6, glucose was 211 on admission.  Liver functions  normal.   HOSPITAL COURSE:  The patient was admitted by Dr. Leone Payor who was covering  the GI service.  She was placed at bedrest on a clear liquid diet, started  on IV fluids, and serial H&H's were obtained.  On the morning of June 29, 2004, she had no complaints of abdominal pain, was still having some  bleeding, but the interval between episodes was increasing.  We continued to  watch her closely.  Her hemoglobin stabilized and she did not require  transfusion.  By June 30, 2004, her bleeding had completely stopped.  She  had not had any further stools for over 12 hours and hemoglobin was 9.  She  was placed on a full liquid diet, ambulated a bit, and on July 01, 2004,  continued to do well with a stable hemoglobin at 9.1 and no further stools.  It was felt that her bleeding had resolved.  She was allowed discharge to  home in a stable and improved condition to follow up with Dr. Marina Goodell on July 12, 2004 at 11:00 a.m. and to call for any problems in the interim.   MEDICATIONS ON DISCHARGE:  1.  She was to hold her aspirin until July 19, 2004.  Use on aspirin or      anti-inflammatories.  2.  Resume her other medications including iron 65 mg daily.  3.  Folic acid 1 mg daily.  4.  Avandia 4 mg b.i.d.  5.  Benazepril/HCTZ 20/12.5 daily.  6.  Metformin 500 b.i.d.  7.  Glipizide 10 mg daily.   DIET:  Low residue x 2 weeks.   CONDITION ON DISCHARGE:  Stable and  improved.      AE/MEDQ  D:  07/28/2004  T:  07/28/2004  Job:  [id number]   cc:   Wilhemina Bonito. Marina Goodell, M.D. [facility]   Jeannett Senior A. Evlyn Kanner, M.D.  [address]  [place]  [place] [address]  Fax: [phone]

## 2010-08-27 NOTE — Consult Note (Signed)
NAME:  Kristi Gonzalez, Kristi Gonzalez               ACCOUNT NO.:  [id number]   MEDICAL RECORD NO.:  [id number]          PATIENT TYPE:  EMS   LOCATION:  ED                           FACILITY:  [facility]   PHYSICIAN:  Iva Boop, M.D. LHCDATE OF BIRTH:  1945-08-06   DATE OF CONSULTATION:  DATE OF DISCHARGE:                                   CONSULTATION   CHIEF COMPLAINT:  Bleeding after colonoscopy.   HISTORY:  This is a pleasant 65 year old African-American woman with a  history of hypertension and diabetes mellitus. She underwent colonoscopy and  EGD by Dr. Yancey Flemings on March 15 because of anemia. She was referred to him  because of a hemoglobin of 10.9 with an MCV of 80 and iron saturation of 10%  with similar numbers in August of 2005. She had no bleeding problems but she  had some mild intermittent constipation and diarrhea. She had a cecal polyp  removed, this was an 11 mm cecal polyp that was sessile removed piecemeal.  She had some erosions in the left colon biopsied and diverticulosis in the  sigmoid. She had an EGD with a small hiatal hernia, duodenal biopsies were  taken to rule out sprue. At about 8:30 on March 20, just last night, she  developed onset of mild cramps followed by hematochezia with bloody stools.  She has had 5 or 6 or those the most recent of which was in the emergency  room with decreased amount she says.  Dr. Lynelle Doctor called me about the patient  and said that she had maroon stool on rectal exam. She denies abdominal  pain, vomiting, presyncope or chest pain at this point.   REVIEW OF SYMPTOMS:  Otherwise negative at this point from what I can tell.  There was no respiratory difficulty or shortness of breath.   PAST MEDICAL HISTORY:  As above plus bilateral tubal ligation.   ALLERGIES:  No known drug allergies.   MEDICATIONS AT HOME:  1.  Iron 65 mg q.d.  2.  Folic acid daily 1 mg.  3.  Avandia 4 mg twice a day.  4.  Benazepril/HCTZ 20/12.5 mg q.d.  5.  Metformin  500 mg b.i.d.  6.  Glipizide 10 mg each day.  7.  Aspirin 81 mg each day is on hold per our protocol.   FAMILY HISTORY:  Mother had colon polyps, there is no colon cancer.   SOCIAL HISTORY:  She is widowed with four daughters. 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 She also works at [employer]. She does not smoke or use alcohol, here with two of her daughters  now.   ADDITIONAL REVIEW OF SYSTEMS:  No history of blood donation, stopped menses  at age 26, all other systems appear negative.   PHYSICAL EXAMINATION:  GENERAL:  Reveals a well appearing, overweight, obese  black woman in no acute distress.  VITAL SIGNS:  Blood pressure is 149/79, pulse 97, temperature 98.1,  respirations 20.  HEENT:  Eyes anicteric.  NECK:  Supple.  CHEST:  Clear.  HEART:  S1, S1, no rubs or gallops.  ABDOMEN:  Soft, nontender, no organomegaly. Bowel sounds mildly increased  perhaps.  RECTAL:  Not repeated.  EXTREMITIES:  Without edema.  NEUROLOGIC:  She is alert and oriented x3.  SKIN:  In areas inspected free of rash.   LABORATORY DATA:  Her hemoglobin this morning is 10.5 with hematocrit 32.8%,  MCV is 80.2, platelet count 300.  White count 7.4, PTT is prolonged at 45  seconds with top normal 37. Her pro time/INR is normal at 0.9 with a pro  time of 12.4 seconds. CMET is normal except for glucose of 211.   ASSESSMENT:  1.  Gastrointestinal bleeding after polypectomy. Possibility of diverticular      bleed exists but most likely she is having bleeding from the cecal      polypectomy site.  2.  Diabetes mellitus not controlled at this point with blood sugar 211.  3.  Hypertension.  4.  Prolonged PTT, uncertain etiology, probably nonspecific and laboratory      error would be my best guess but she could potentially have some sort of      undiagnosed coagulopathy.   PLAN:  1.  Admit the patient, hydrate her, transfuse if needed. The risks are      reviewed  with the patient and the daughter that include transfusion      reaction, hepatitis and AIDS.  Recheck PTT, might need a mixing study.  2.  If she has persistent bleeding, a colonoscopy might be indicated.  3.  Hold her antihypertensives but give oral diabetic agents and check blood      sugars. May need sliding scale. Since she is going to be on clear      liquids, I would not give her the sliding scale at this point but we      will see what her blood sugars do and add that during the day if needed.      May need sliding coverage as well, will order that in case she needs it.   I appreciate the opportunity to care for this patient.      CEG/MEDQ  D:  06/29/2004  T:  06/29/2004  Job:  [id number]   cc:   Wilhemina Bonito. Marina Goodell, M.D. [facility]   Jeannett Senior A. Evlyn Kanner, M.D.  [address]  [place]  [place] [address]  Fax: [phone]

## 2011-01-11 LAB — POCT I-STAT, CHEM 8
Calcium, Ion: 1.11 — ABNORMAL LOW
Glucose, Bld: 252 — ABNORMAL HIGH
HCT: 35 — ABNORMAL LOW
Hemoglobin: 11.9 — ABNORMAL LOW
Potassium: 4.7

## 2011-01-12 LAB — COMPREHENSIVE METABOLIC PANEL
Albumin: 3.5
Alkaline Phosphatase: 48
BUN: 7
Chloride: 106
Glucose, Bld: 90
Potassium: 4.1
Total Bilirubin: 0.3

## 2011-01-12 LAB — DIFFERENTIAL
Basophils Absolute: 0.1
Basophils Relative: 2 — ABNORMAL HIGH
Neutro Abs: 3.8
Neutrophils Relative %: 58

## 2011-01-12 LAB — CBC
HCT: 30.7 — ABNORMAL LOW
HCT: 32 — ABNORMAL LOW
Hemoglobin: 10.1 — ABNORMAL LOW
Hemoglobin: 9.7 — ABNORMAL LOW
MCHC: 31.6
MCV: 82
Platelets: 392
RBC: 3.74 — ABNORMAL LOW
RDW: 15.1
WBC: 6.2
WBC: 6.4

## 2011-01-12 LAB — POCT I-STAT, CHEM 8
BUN: 14
Calcium, Ion: 1.19
Chloride: 104
Creatinine, Ser: 0.8
Glucose, Bld: 175 — ABNORMAL HIGH
HCT: 31 — ABNORMAL LOW
Hemoglobin: 10.5 — ABNORMAL LOW
Potassium: 4.1
Sodium: 139
TCO2: 29

## 2011-01-12 LAB — POCT CARDIAC MARKERS
CKMB, poc: 2.3
Myoglobin, poc: 55.6
Troponin i, poc: <0.05

## 2011-01-12 LAB — URINALYSIS, ROUTINE W REFLEX MICROSCOPIC
Bilirubin Urine: NEGATIVE
Glucose, UA: NEGATIVE
Hgb urine dipstick: NEGATIVE
Ketones, ur: NEGATIVE
Nitrite: NEGATIVE
Protein, ur: NEGATIVE
Specific Gravity, Urine: 1.011
Urobilinogen, UA: 1
pH: 6

## 2011-01-12 LAB — T4: T4, Total: 10.2

## 2011-01-12 LAB — B-NATRIURETIC PEPTIDE (CONVERTED LAB): Pro B Natriuretic peptide (BNP): 35

## 2011-01-12 LAB — CARDIAC PANEL(CRET KIN+CKTOT+MB+TROPI): Total CK: 177

## 2011-01-12 LAB — PROTIME-INR
INR: 1
Prothrombin Time: 13.3

## 2011-01-12 LAB — HEMOGLOBIN A1C: Mean Plasma Glucose: 174

## 2011-01-12 LAB — GLUCOSE, CAPILLARY
Glucose-Capillary: 102 — ABNORMAL HIGH
Glucose-Capillary: 231 — ABNORMAL HIGH

## 2011-01-14 LAB — DIFFERENTIAL
Basophils Absolute: 0 10*3/uL (ref 0.0–0.1)
Eosinophils Relative: 3 % (ref 0–5)
Lymphocytes Relative: 42 % (ref 12–46)
Monocytes Absolute: 0.4 10*3/uL (ref 0.1–1.0)
Monocytes Relative: 6 % (ref 3–12)

## 2011-01-14 LAB — POCT I-STAT, CHEM 8
BUN: 18 mg/dL (ref 6–23)
Chloride: 105 mEq/L (ref 96–112)
Creatinine, Ser: 0.9 mg/dL (ref 0.4–1.2)
Potassium: 4.4 mEq/L (ref 3.5–5.1)
Sodium: 136 mEq/L (ref 135–145)
TCO2: 23 mmol/L (ref 0–100)

## 2011-01-14 LAB — CBC
HCT: 32.9 % — ABNORMAL LOW (ref 36.0–46.0)
Hemoglobin: 10.6 g/dL — ABNORMAL LOW (ref 12.0–15.0)
RDW: 16.5 % — ABNORMAL HIGH (ref 11.5–15.5)

## 2011-01-14 LAB — POCT CARDIAC MARKERS
CKMB, poc: 2.4 ng/mL (ref 1.0–8.0)
Myoglobin, poc: 92.1 ng/mL (ref 12–200)
Troponin i, poc: <0.05 ng/mL (ref 0.00–0.09)

## 2011-06-22 ENCOUNTER — Other Ambulatory Visit: Payer: Self-pay | Admitting: Cardiology

## 2011-07-18 ENCOUNTER — Encounter: Payer: Self-pay | Admitting: *Deleted

## 2011-12-30 ENCOUNTER — Encounter: Payer: Self-pay | Admitting: Internal Medicine

## 2012-09-14 ENCOUNTER — Encounter: Payer: Self-pay | Admitting: Cardiology

## 2012-11-07 ENCOUNTER — Other Ambulatory Visit: Payer: Self-pay | Admitting: Internal Medicine

## 2013-01-01 ENCOUNTER — Other Ambulatory Visit: Payer: Self-pay | Admitting: Nurse Practitioner

## 2013-01-02 ENCOUNTER — Other Ambulatory Visit: Payer: Self-pay | Admitting: Nurse Practitioner

## 2013-08-27 ENCOUNTER — Emergency Department (HOSPITAL_COMMUNITY): Payer: Medicare HMO

## 2013-08-27 ENCOUNTER — Emergency Department (HOSPITAL_COMMUNITY)
Admission: EM | Admit: 2013-08-27 | Discharge: 2013-08-27 | Disposition: A | Discharge status: Home / Self Care | Payer: Medicare HMO | Attending: Emergency Medicine | Admitting: Emergency Medicine

## 2013-08-27 ENCOUNTER — Encounter (HOSPITAL_COMMUNITY): Payer: Self-pay | Admitting: Emergency Medicine

## 2013-08-27 DIAGNOSIS — R05 Cough: Secondary | ICD-10-CM | POA: Insufficient documentation

## 2013-08-27 DIAGNOSIS — E162 Hypoglycemia, unspecified: Secondary | ICD-10-CM

## 2013-08-27 DIAGNOSIS — R059 Cough, unspecified: Secondary | ICD-10-CM | POA: Insufficient documentation

## 2013-08-27 DIAGNOSIS — D649 Anemia, unspecified: Secondary | ICD-10-CM | POA: Insufficient documentation

## 2013-08-27 DIAGNOSIS — Z79899 Other long term (current) drug therapy: Secondary | ICD-10-CM | POA: Insufficient documentation

## 2013-08-27 DIAGNOSIS — R29898 Other symptoms and signs involving the musculoskeletal system: Secondary | ICD-10-CM

## 2013-08-27 DIAGNOSIS — Z7982 Long term (current) use of aspirin: Secondary | ICD-10-CM | POA: Insufficient documentation

## 2013-08-27 DIAGNOSIS — I10 Essential (primary) hypertension: Secondary | ICD-10-CM | POA: Insufficient documentation

## 2013-08-27 DIAGNOSIS — Z794 Long term (current) use of insulin: Secondary | ICD-10-CM | POA: Insufficient documentation

## 2013-08-27 DIAGNOSIS — E1169 Type 2 diabetes mellitus with other specified complication: Secondary | ICD-10-CM | POA: Insufficient documentation

## 2013-08-27 DIAGNOSIS — M6281 Muscle weakness (generalized): Secondary | ICD-10-CM | POA: Insufficient documentation

## 2013-08-27 DIAGNOSIS — I4891 Unspecified atrial fibrillation: Secondary | ICD-10-CM | POA: Insufficient documentation

## 2013-08-27 LAB — I-STAT CG4 LACTIC ACID, ED: LACTIC ACID, VENOUS: 0.83 mmol/L (ref 0.5–2.2)

## 2013-08-27 LAB — URINALYSIS, ROUTINE W REFLEX MICROSCOPIC
Bilirubin Urine: NEGATIVE
Glucose, UA: >1000 mg/dL — AB
Hgb urine dipstick: NEGATIVE
Ketones, ur: NEGATIVE mg/dL
LEUKOCYTES UA: NEGATIVE
NITRITE: NEGATIVE
Protein, ur: NEGATIVE mg/dL
SPECIFIC GRAVITY, URINE: 1.013 (ref 1.005–1.030)
Urobilinogen, UA: 1 mg/dL (ref 0.0–1.0)
pH: 5.5 (ref 5.0–8.0)

## 2013-08-27 LAB — CBC
HCT: 33.3 % — ABNORMAL LOW (ref 36.0–46.0)
Hemoglobin: 10.4 g/dL — ABNORMAL LOW (ref 12.0–15.0)
MCH: 25.2 pg — ABNORMAL LOW (ref 26.0–34.0)
MCHC: 31.2 g/dL (ref 30.0–36.0)
MCV: 80.6 fL (ref 78.0–100.0)
PLATELETS: 276 10*3/uL (ref 150–400)
RBC: 4.13 MIL/uL (ref 3.87–5.11)
RDW: 13.6 % (ref 11.5–15.5)
WBC: 5.4 10*3/uL (ref 4.0–10.5)

## 2013-08-27 LAB — BASIC METABOLIC PANEL
BUN: 16 mg/dL (ref 6–23)
CO2: 25 meq/L (ref 19–32)
Calcium: 10 mg/dL (ref 8.4–10.5)
Chloride: 94 mEq/L — ABNORMAL LOW (ref 96–112)
Creatinine, Ser: 0.78 mg/dL (ref 0.50–1.10)
GFR calc Af Amer: >90 mL/min (ref >90)
GFR calc non Af Amer: 85 mL/min — ABNORMAL LOW (ref >90)
Glucose, Bld: 55 mg/dL — ABNORMAL LOW (ref 70–99)
Potassium: 3.7 mEq/L (ref 3.7–5.3)
SODIUM: 134 meq/L — AB (ref 137–147)

## 2013-08-27 LAB — I-STAT TROPONIN, ED: Troponin i, poc: 0 ng/mL (ref 0.00–0.08)

## 2013-08-27 LAB — URINE MICROSCOPIC-ADD ON

## 2013-08-27 LAB — CBG MONITORING, ED
GLUCOSE-CAPILLARY: 55 mg/dL — AB (ref 70–99)
Glucose-Capillary: 142 mg/dL — ABNORMAL HIGH (ref 70–99)
Glucose-Capillary: 105 mg/dL — ABNORMAL HIGH (ref 70–99)

## 2013-08-27 MED ORDER — DEXTROSE 50 % IV SOLN
25.0000 mL | Freq: Once | INTRAVENOUS | Status: AC
  Administered 2013-08-27: 25 mL via INTRAVENOUS
  Filled 2013-08-27: qty 50

## 2013-08-27 NOTE — ED Notes (Signed)
Patient transported to CT 

## 2013-08-27 NOTE — ED Notes (Addendum)
CBG clarification: Cbg of 55 was done at 1514hrs, the glucometer was not docked hence the result did not cross over to epic in a timely manner, CBG 142 reflects the right time that is was done.

## 2013-08-27 NOTE — Discharge Instructions (Signed)

## 2013-08-27 NOTE — ED Notes (Signed)
Patient transported to MRI 

## 2013-08-27 NOTE — ED Notes (Addendum)
Pt c/o left leg more toward foot weakness x 1 day, denies pain. Pt is diabetic. Pt states she could satnd up on it just couldn't put pressure on it. Denies any weakness in left UE.

## 2013-08-27 NOTE — ED Notes (Signed)
Patient given 2 containers of orange juice, per EDP Mingo Amber

## 2013-08-27 NOTE — ED Provider Notes (Signed)
CSN: [id number]     Arrival date & time 08/27/13  1431 History   First MD Initiated Contact with Patient 08/27/13 1458     Chief Complaint  Patient presents with  . Extremity Weakness    left     (Consider location/radiation/quality/duration/timing/severity/associated sxs/prior Treatment) Patient is a 68 y.o. female presenting with extremity weakness. The history is provided by the patient.  Extremity Weakness This is a new problem. Episode onset: Began around 2 o'clock - 1 hour ago. The problem occurs constantly. The problem has not changed since onset.Pertinent negatives include no chest pain, no abdominal pain, no headaches and no shortness of breath. Nothing aggravates the symptoms. Nothing relieves the symptoms. She has tried nothing for the symptoms.    Past Medical History  Diagnosis Date  . PAF (paroxysmal atrial fibrillation)   . DM (diabetes mellitus)   . HTN (hypertension)   . Anemia    History reviewed. No pertinent past surgical history. Family History  Problem Relation Age of Onset  . Heart disease     History  Substance Use Topics  . Smoking status: Never Smoker   . Smokeless tobacco: Not on file  . Alcohol Use: No   OB History   Grav Para Term Preterm Abortions TAB SAB Ect Mult Living                 Review of Systems  Constitutional: Negative for fever.  Respiratory: Positive for cough (today). Negative for shortness of breath.   Cardiovascular: Negative for chest pain.  Gastrointestinal: Negative for nausea, vomiting and abdominal pain.  Musculoskeletal: Positive for extremity weakness.  Neurological: Negative for headaches.  All other systems reviewed and are negative.     Allergies  Codeine  Home Medications   Prior to Admission medications   Medication Sig Start Date End Date Taking? Authorizing Provider  aspirin 81 MG tablet Take 81 mg by mouth daily.   Yes Historical Provider, MD  benazepril-hydrochlorthiazide (LOTENSIN HCT) 20-12.5 MG  per tablet Take 1 tablet by mouth daily.   Yes Historical Provider, MD  digoxin (LANOXIN) 0.125 MG tablet Take 0.125 mg by mouth daily.   Yes Historical Provider, MD  folic acid (FOLVITE) 937 MCG tablet Take 400 mcg by mouth daily.   Yes Historical Provider, MD  glipiZIDE (GLUCOTROL XL) 10 MG 24 hr tablet Take 10 mg by mouth daily.   Yes Historical Provider, MD  HUMALOG MIX 75/25 KWIKPEN (75-25) 100 UNIT/ML SUPN INJECT 30 UNITS IN THE MORNING AND 17 UNITS IN THE EVENING 11/07/12  Yes Pricilla Larsson, NP  insulin glargine (LANTUS) 100 UNIT/ML injection Inject into the skin as directed. Sliding scale as needed.   Yes Historical Provider, MD  iron polysaccharides (FERREX 150) 150 MG capsule Take 150 mg by mouth 2 (two) times daily.   Yes Historical Provider, MD  metFORMIN (GLUCOPHAGE) 1000 MG tablet Take 1/2 tab in the AM and 1 tab in the PM   Yes Historical Provider, MD  metoprolol succinate (TOPROL-XL) 25 MG 24 hr tablet Take 50 mg by mouth daily.    Yes Historical Provider, MD  simvastatin (ZOCOR) 40 MG tablet Take 40 mg by mouth every evening.   Yes Historical Provider, MD   BP 137/73  Pulse 95  Temp(Src) 97.9 F (36.6 C) (Oral)  Resp 16  SpO2 100% Physical Exam  Nursing note and vitals reviewed. Constitutional: She is oriented to person, place, and time. She appears well-developed and well-nourished. No distress.  HENT:  Head: Normocephalic and atraumatic.  Mouth/Throat: Oropharynx is clear and moist. No oropharyngeal exudate.  Eyes: EOM are normal. Pupils are equal, round, and reactive to light.  Neck: Normal range of motion. Neck supple.  Cardiovascular: Normal rate and regular rhythm.  Exam reveals no friction rub.   No murmur heard. Pulmonary/Chest: Effort normal and breath sounds normal. No respiratory distress. She has no wheezes. She has no rales.  Abdominal: Soft. She exhibits no distension. There is no tenderness. There is no rebound.  Musculoskeletal: Normal range of motion.  She exhibits no edema.  Neurological: She is oriented to person, place, and time. No cranial nerve deficit. She exhibits normal muscle tone. Coordination normal.  Alert, but giving slow responses, inattentive  Skin: No rash noted. She is not diaphoretic.    ED Course  Procedures (including critical care time) Labs Review Labs Reviewed - No data to display  Imaging Review Dg Chest 2 View  08/27/2013   CLINICAL DATA:  Left lower extremity weakness with history of atrial fibrillation and diabetes  EXAM: CHEST  2 VIEW  COMPARISON:  Portable chest x-ray dated March 21, 2008  FINDINGS: The lungs are adequately inflated. There is no focal infiltrate. The cardiopericardial silhouette is top-normal in size. The pulmonary vascularity is prominent centrally but not engorged peripherally. There is no pleural effusion. The mediastinum is normal in width. There is mild degenerative disc space narrowing at multiple thoracic levels.  IMPRESSION: There is mild stable enlargement of the cardiac silhouette with prominence of the central pulmonary vascularity. There is no evidence of pneumonia nor CHF.   Electronically Signed   By: David  Martinique   On: 08/27/2013 16:07   Ct Head Wo Contrast  08/27/2013   CLINICAL DATA:  left leg  weakness for 1 day.  EXAM: CT HEAD WITHOUT CONTRAST  TECHNIQUE: Contiguous axial images were obtained from the base of the skull through the vertex without intravenous contrast.  COMPARISON:  None.  FINDINGS: Scattered and confluent hypodensity within the periventricular and deep white matter of the cerebral hemispheres is most compatible with mild chronic microvascular ischemic disease. Cerebral volume within normal limits for patient age. Probable small remote lacunar infarct noted within the right occipital lobe. Additional subcentimeter hypodensity within the left occipital lobe may represent a tiny remote infarct as well.  There is no acute intracranial hemorrhage. Age indeterminate  hypodensity measuring approximately 9 mm seen within the subinsular region/inferior right basal ganglia (series 2, image 12). No mass lesion or midline shift. Gray-white matter differentiation is well maintained. Ventricles are normal in size without evidence of hydrocephalus. CSF containing spaces are within normal limits. No extra-axial fluid collection.  The calvarium is intact.  Orbital soft tissues are within normal limits.  The paranasal sinuses and mastoid air cells are well pneumatized and free of fluid.  Scalp soft tissues are unremarkable.  IMPRESSION: 1. Age-indeterminate hypodensity involving the right basal ganglia/subinsular region. While this finding may be chronic in nature, possible acute ischemia could also have this appearance. Correlation with symptomatology for possible infarct in this region recommended. Further evaluation with brain MRI could also be performed if acute ischemia is a concern. 2. Mild chronic microvascular ischemic changes involving the supratentorial white matter.   Electronically Signed   By: Jeannine Boga M.D.   On: 08/27/2013 17:16   Mr Brain Wo Contrast  08/27/2013   CLINICAL DATA:  Left-sided weakness  EXAM: MRI HEAD WITHOUT CONTRAST  TECHNIQUE: Multiplanar, multiecho pulse sequences of the brain and  surrounding structures were obtained without intravenous contrast.  COMPARISON:  CT head 08/27/2013  FINDINGS: Ventricle size is normal.  Cerebral volume normal for age.  Negative for acute infarct. Minimal changes in the white matter consistent with chronic ischemia.  Negative for hemorrhage or mass. No shift of the midline structures.  Mild mucosal edema in the maxillary sinuses bilaterally.  IMPRESSION: No acute abnormality.   Electronically Signed   By: Franchot Gallo M.D.   On: 08/27/2013 18:51     EKG Interpretation   Date/Time:  Tuesday Aug 27 2013 15:27:48 EDT Ventricular Rate:  104 PR Interval:  187 QRS Duration: 108 QT Interval:  383 QTC  Calculation: 504 R Axis:   -24 Text Interpretation:  Sinus tachycardia Paired ventricular premature  complexes Borderline left axis deviation Low voltage, precordial leads  Borderline T abnormalities, anterior leads Anterior T wave flattening, new  Confirmed by Mingo Amber  MD, Daelynn Blower ([id number]) on 08/27/2013 3:31:54 PM      MDM   Final diagnoses:  Hypoglycemia  Leg weakness    21F with hx of DM, HTN presents with acute onset of L leg weakness. States mostly in L foot. Happened while she was standing at work about 1 hour ago. No hx of stroke. Here, poor historian, oriented to place and situation, but giving slow responses and seems "out of it." No aphasia. Daughter at bedside states different mental status. On Neuro exam, cranial nerves functioning, normal UE and LE strength. Gait is slow, but no ataxia. Denies abdominal or chest pain. No vomiting, diarrhea.  Vitals stable. Initial blood sugar is 55. Will give sugar to see if that improves patient's status. No extremity deficits or cranial nerve deficits present to make patient Code Stroke on initial exam at 3:10. Will see if PO sugar and IV glucose help her status since her CBG is 55. On re-exam after D50 administration, much improved. Daughter states she has perked up, patient feels better. Denies any weakness at this time. Will continue to monitor blood sugar. CT shows possible R basal ganglia stroke. I spoke with Dr. Leonel Ramsay with Neurology. He feels her presentation would not already be evident on CT with possible stroke. He thinks her hypoglycemia unmasked a prior unknown stroke. Will MRI to look for definitive acute CVA evidence.  MR brain normal. Sugars stable. Stable for discharge, instructed to f/u with Dr. Forde Dandy for her hypoglycemia and for stroke risk stratification. I have reviewed all labs and imaging and considered them in my medical decision making.   Osvaldo Shipper, MD 08/27/13 [phone]

## 2013-08-27 NOTE — ED Notes (Signed)
Patient transported to X-ray 

## 2013-08-27 NOTE — ED Notes (Signed)
MD at bedside. 

## 2014-04-10 ENCOUNTER — Emergency Department (HOSPITAL_COMMUNITY)
Admission: EM | Admit: 2014-04-10 | Discharge: 2014-04-10 | Disposition: A | Discharge status: Home / Self Care | Payer: Medicare HMO | Attending: Emergency Medicine | Admitting: Emergency Medicine

## 2014-04-10 ENCOUNTER — Encounter (HOSPITAL_COMMUNITY): Payer: Self-pay | Admitting: *Deleted

## 2014-04-10 DIAGNOSIS — I480 Paroxysmal atrial fibrillation: Secondary | ICD-10-CM | POA: Insufficient documentation

## 2014-04-10 DIAGNOSIS — D649 Anemia, unspecified: Secondary | ICD-10-CM | POA: Diagnosis not present

## 2014-04-10 DIAGNOSIS — E162 Hypoglycemia, unspecified: Secondary | ICD-10-CM

## 2014-04-10 DIAGNOSIS — Z79899 Other long term (current) drug therapy: Secondary | ICD-10-CM | POA: Insufficient documentation

## 2014-04-10 DIAGNOSIS — M79652 Pain in left thigh: Secondary | ICD-10-CM | POA: Insufficient documentation

## 2014-04-10 DIAGNOSIS — I10 Essential (primary) hypertension: Secondary | ICD-10-CM | POA: Insufficient documentation

## 2014-04-10 DIAGNOSIS — Z7982 Long term (current) use of aspirin: Secondary | ICD-10-CM | POA: Diagnosis not present

## 2014-04-10 DIAGNOSIS — E11649 Type 2 diabetes mellitus with hypoglycemia without coma: Secondary | ICD-10-CM | POA: Diagnosis not present

## 2014-04-10 DIAGNOSIS — M79606 Pain in leg, unspecified: Secondary | ICD-10-CM | POA: Diagnosis present

## 2014-04-10 LAB — CBG MONITORING, ED: Glucose-Capillary: 56 mg/dL — ABNORMAL LOW (ref 70–99)

## 2014-04-10 MED ORDER — TRAMADOL HCL 50 MG PO TABS: 50.0000 mg | ORAL_TABLET | Freq: Four times a day (QID) | ORAL | Status: DC | PRN

## 2014-04-10 MED ORDER — TRAMADOL HCL 50 MG PO TABS
50.0000 mg | ORAL_TABLET | Freq: Once | ORAL | Status: AC
  Administered 2014-04-10: 50 mg via ORAL
  Filled 2014-04-10: qty 1

## 2014-04-10 MED ORDER — ACETAMINOPHEN 325 MG PO TABS: 650.0000 mg | ORAL_TABLET | Freq: Four times a day (QID) | ORAL | Status: AC | PRN

## 2014-04-10 MED ORDER — IBUPROFEN 200 MG PO TABS
400.0000 mg | ORAL_TABLET | Freq: Once | ORAL | Status: AC
  Administered 2014-04-10: 400 mg via ORAL
  Filled 2014-04-10: qty 2

## 2014-04-10 NOTE — ED Provider Notes (Signed)
CSN: [id number]     Arrival date & time 04/10/14  1548 History   First MD Initiated Contact with Patient 04/10/14 [phone]     Chief Complaint  Patient presents with  . Leg Pain     (Consider location/radiation/quality/duration/timing/severity/associated sxs/prior Treatment) HPI Comments: The patient is a 68 -year-old female with past medical history of paroxysmal A. fib, diabetes, hypertension, presents emergency room chief complaint of left thigh discomfort. The patient reports 2 weeks of burning left thigh pain, worsened with movement. Denies low back pain, injury, saddle paresthesias, urinary incontinence, bowel incontinence. She reports home glucose runs anywhere from 70s to 400s. Patient denies lower shin the weakness, fall due to discomfort. No lower extremity edema.  Patient reports abnormal sensation when touching left thigh. PCP: Sheela Stack, MD  Patient is a 68 y.o. female presenting with leg pain. The history is provided by the patient. No language interpreter was used.  Leg Pain Associated symptoms: no back pain     Past Medical History  Diagnosis Date  . PAF (paroxysmal atrial fibrillation)   . DM (diabetes mellitus)   . HTN (hypertension)   . Anemia    History reviewed. No pertinent past surgical history. Family History  Problem Relation Age of Onset  . Heart disease     History  Substance Use Topics  . Smoking status: Never Smoker   . Smokeless tobacco: Not on file  . Alcohol Use: No   OB History    No data available     Review of Systems  Cardiovascular: Negative for leg swelling.  Gastrointestinal: Negative for abdominal pain, diarrhea, constipation and abdominal distention.  Genitourinary: Negative for dysuria, enuresis and difficulty urinating.  Musculoskeletal: Positive for myalgias. Negative for back pain.  Neurological: Negative for weakness and numbness.      Allergies  Codeine  Home Medications   Prior to Admission medications    Medication Sig Start Date End Date Taking? Authorizing Provider  aspirin 81 MG tablet Take 81 mg by mouth daily.   Yes Historical Provider, MD  benazepril-hydrochlorthiazide (LOTENSIN HCT) 20-12.5 MG per tablet Take 1 tablet by mouth daily.   Yes Historical Provider, MD  canagliflozin (INVOKANA) 300 MG TABS tablet Take 150 mg by mouth daily before breakfast.   Yes Historical Provider, MD  digoxin (LANOXIN) 0.125 MG tablet Take 0.125 mg by mouth daily.   Yes Historical Provider, MD  folic acid (FOLVITE) 920 MCG tablet Take 400 mcg by mouth daily.   Yes Historical Provider, MD  glipiZIDE (GLUCOTROL XL) 10 MG 24 hr tablet Take 10 mg by mouth daily.   Yes Historical Provider, MD  HUMALOG MIX 75/25 KWIKPEN (75-25) 100 UNIT/ML SUPN INJECT 30 UNITS IN THE MORNING AND 17 UNITS IN THE EVENING Patient taking differently: INJECT 42 UNITS IN THE MORNING AND 17 UNITS IN THE EVENING 11/07/12  Yes Lauree Chandler, NP  iron polysaccharides (FERREX 150) 150 MG capsule Take 150 mg by mouth 2 (two) times daily.   Yes Historical Provider, MD  metoprolol succinate (TOPROL-XL) 50 MG 24 hr tablet Take 50 mg by mouth daily. Take with or immediately following a meal.   Yes Historical Provider, MD  simvastatin (ZOCOR) 40 MG tablet Take 40 mg by mouth every evening.   Yes Historical Provider, MD   BP 142/66 mmHg  Pulse 98  Temp(Src) 97.7 F (36.5 C) (Oral)  Resp 18  SpO2 97% Physical Exam  Constitutional: She is oriented to person, place, and time. She appears well-developed  and well-nourished. No distress.  HENT:  Head: Normocephalic and atraumatic.  Neck: Neck supple.  Cardiovascular:  No lower extremity edema.  Pulmonary/Chest: Effort normal. No respiratory distress.  Abdominal: Soft. There is no tenderness. There is no rebound.  Musculoskeletal:  No midline C-spine, T-spine, or L-spine tenderness with no step-offs, crepitus, or deformities noted. Lower shin the discomfort not reproducible with palpation of  bilateral SI joints. No thigh tenderness with stretching of IT band.  Neurological: She is alert and oriented to person, place, and time.  Normal sensation to light touch to bilateral lower extremities, specifically all parts of 5. Good strength bilaterally. ITP and nontender to palpation or with manipulation/stretching.  Skin: Skin is warm and dry. She is not diaphoretic.  Psychiatric: She has a normal mood and affect. Her behavior is normal.  Nursing note and vitals reviewed.   ED Course  Procedures (including critical care time) Labs Review Labs Reviewed  CBG MONITORING, ED - Abnormal; Notable for the following:    Glucose-Capillary 56 (*)    All other components within normal limits    Imaging Review No results found.   EKG Interpretation None      MDM   Final diagnoses:  Left thigh pain  Hypoglycemia   Patient with poorly controlled diabetes presents with left thigh discomfort. Likely radiculopathy first neuropathy. Plan to treat with anti-inflammatories, follow-up with PCP.  Patient's CBG 56 on arrival, given juice. Reports compliance with Humalog (75-25).   Dr. Delice Lesch also evaluated the patient, family members concerned about a RN or tech saying that the patient needed a ultrasound. There are no clinical signs of a DVT at this time. Discussed at length with patient and patient's family, agreed to order outpatient DVT we'll not give Lovenox at this time. Plan to treat for pain, follow-up with PCP for thigh discomfort.  Harvie Heck, PA-C 04/10/14 [place], MD 04/10/14 [phone]

## 2014-04-10 NOTE — ED Notes (Signed)
Pt states she has had burning pain in her outer left thigh for the past month. Pt denies injury to leg. Pt states he leg also that she has decreased sensation in her thigh. Pt has hx of diabetes.

## 2014-04-10 NOTE — Discharge Instructions (Signed)
You're thigh pain is likely due to to a nerve irritation.  Return to [facility] for your outpatient ultrasound. Call for a follow up appointment with a Family or Primary Care Provider.  Return if Symptoms worsen.   Take medication as prescribed.

## 2014-04-11 ENCOUNTER — Ambulatory Visit (HOSPITAL_COMMUNITY)
Admission: RE | Admit: 2014-04-11 | Discharge: 2014-04-11 | Disposition: A | Discharge status: Home / Self Care | Payer: Medicare HMO | Source: Ambulatory Visit | Attending: Endocrinology | Admitting: Endocrinology

## 2014-04-11 DIAGNOSIS — M79609 Pain in unspecified limb: Secondary | ICD-10-CM

## 2014-04-11 DIAGNOSIS — M79605 Pain in left leg: Secondary | ICD-10-CM | POA: Insufficient documentation

## 2014-06-12 ENCOUNTER — Emergency Department (HOSPITAL_COMMUNITY)
Admission: EM | Admit: 2014-06-12 | Discharge: 2014-06-12 | Disposition: A | Discharge status: Home / Self Care | Payer: Medicare PPO | Attending: Emergency Medicine | Admitting: Emergency Medicine

## 2014-06-12 ENCOUNTER — Emergency Department (HOSPITAL_COMMUNITY): Payer: Medicare PPO

## 2014-06-12 ENCOUNTER — Encounter (HOSPITAL_COMMUNITY): Payer: Self-pay | Admitting: Nurse Practitioner

## 2014-06-12 DIAGNOSIS — D649 Anemia, unspecified: Secondary | ICD-10-CM | POA: Insufficient documentation

## 2014-06-12 DIAGNOSIS — R102 Pelvic and perineal pain: Secondary | ICD-10-CM | POA: Diagnosis present

## 2014-06-12 DIAGNOSIS — Z79899 Other long term (current) drug therapy: Secondary | ICD-10-CM | POA: Diagnosis not present

## 2014-06-12 DIAGNOSIS — R1032 Left lower quadrant pain: Secondary | ICD-10-CM | POA: Insufficient documentation

## 2014-06-12 DIAGNOSIS — I480 Paroxysmal atrial fibrillation: Secondary | ICD-10-CM | POA: Diagnosis not present

## 2014-06-12 DIAGNOSIS — Z7982 Long term (current) use of aspirin: Secondary | ICD-10-CM | POA: Diagnosis not present

## 2014-06-12 DIAGNOSIS — E119 Type 2 diabetes mellitus without complications: Secondary | ICD-10-CM | POA: Insufficient documentation

## 2014-06-12 DIAGNOSIS — I10 Essential (primary) hypertension: Secondary | ICD-10-CM | POA: Diagnosis not present

## 2014-06-12 LAB — CBC WITH DIFFERENTIAL/PLATELET
BASOS ABS: 0 10*3/uL (ref 0.0–0.1)
BASOS PCT: 0 % (ref 0–1)
EOS PCT: 1 % (ref 0–5)
Eosinophils Absolute: 0.1 10*3/uL (ref 0.0–0.7)
HCT: 36 % (ref 36.0–46.0)
Hemoglobin: 10.9 g/dL — ABNORMAL LOW (ref 12.0–15.0)
Lymphocytes Relative: 46 % (ref 12–46)
Lymphs Abs: 2.5 10*3/uL (ref 0.7–4.0)
MCH: 25.3 pg — ABNORMAL LOW (ref 26.0–34.0)
MCHC: 30.3 g/dL (ref 30.0–36.0)
MCV: 83.5 fL (ref 78.0–100.0)
MONO ABS: 0.5 10*3/uL (ref 0.1–1.0)
Monocytes Relative: 9 % (ref 3–12)
Neutro Abs: 2.4 10*3/uL (ref 1.7–7.7)
Neutrophils Relative %: 44 % (ref 43–77)
Platelets: 265 10*3/uL (ref 150–400)
RBC: 4.31 MIL/uL (ref 3.87–5.11)
RDW: 13.2 % (ref 11.5–15.5)
WBC: 5.5 10*3/uL (ref 4.0–10.5)

## 2014-06-12 LAB — BASIC METABOLIC PANEL
Anion gap: 5 (ref 5–15)
BUN: 16 mg/dL (ref 6–23)
CALCIUM: 9.4 mg/dL (ref 8.4–10.5)
CO2: 29 mmol/L (ref 19–32)
CREATININE: 0.83 mg/dL (ref 0.50–1.10)
Chloride: 101 mmol/L (ref 96–112)
GFR calc non Af Amer: 71 mL/min — ABNORMAL LOW (ref >90)
GFR, EST AFRICAN AMERICAN: 82 mL/min — AB (ref >90)
Glucose, Bld: 187 mg/dL — ABNORMAL HIGH (ref 70–99)
Potassium: 4.1 mmol/L (ref 3.5–5.1)
Sodium: 135 mmol/L (ref 135–145)

## 2014-06-12 LAB — DIGOXIN LEVEL: Digoxin Level: <0.2 ng/mL — ABNORMAL LOW (ref 0.8–2.0)

## 2014-06-12 LAB — URINALYSIS, ROUTINE W REFLEX MICROSCOPIC
Bilirubin Urine: NEGATIVE
Glucose, UA: 500 mg/dL — AB
HGB URINE DIPSTICK: NEGATIVE
Ketones, ur: NEGATIVE mg/dL
Leukocytes, UA: NEGATIVE
Nitrite: NEGATIVE
Protein, ur: NEGATIVE mg/dL
SPECIFIC GRAVITY, URINE: 1.014 (ref 1.005–1.030)
Urobilinogen, UA: 1 mg/dL (ref 0.0–1.0)
pH: 5.5 (ref 5.0–8.0)

## 2014-06-12 LAB — CBG MONITORING, ED: Glucose-Capillary: 201 mg/dL — ABNORMAL HIGH (ref 70–99)

## 2014-06-12 LAB — I-STAT CG4 LACTIC ACID, ED: Lactic Acid, Venous: 0.62 mmol/L (ref 0.5–2.0)

## 2014-06-12 MED ORDER — IOHEXOL 300 MG/ML  SOLN
100.0000 mL | Freq: Once | INTRAMUSCULAR | Status: AC | PRN
  Administered 2014-06-12: 100 mL via INTRAVENOUS

## 2014-06-12 MED ORDER — ACETAMINOPHEN 500 MG PO TABS
1000.0000 mg | ORAL_TABLET | Freq: Once | ORAL | Status: AC
  Administered 2014-06-12: 1000 mg via ORAL
  Filled 2014-06-12: qty 2

## 2014-06-12 MED ORDER — SODIUM CHLORIDE 0.9 % IV BOLUS (SEPSIS)
500.0000 mL | Freq: Once | INTRAVENOUS | Status: AC
  Administered 2014-06-12: 500 mL via INTRAVENOUS

## 2014-06-12 NOTE — ED Provider Notes (Signed)
CSN: [id number]     Arrival date & time 06/12/14  1523 History   First MD Initiated Contact with Patient 06/12/14 1546     Chief Complaint  Patient presents with  . Pelvic Pain     (Consider location/radiation/quality/duration/timing/severity/associated sxs/prior Treatment) HPI  Kristi Gonzalez is a 69 y.o. female non-insulin-dependent diabetic, hypertension, hyperlipidemia complaining of left lower quadrant pain onset 5 days ago rated at 7 out of 10 and alleviated with Tylenol. Patient denies fever, chills, nausea, vomiting, change in bowel or bladder habits, abnormal vaginal discharge. No extensive abdominal surgeries, passing gas normally.  Past Medical History  Diagnosis Date  . PAF (paroxysmal atrial fibrillation)   . DM (diabetes mellitus)   . HTN (hypertension)   . Anemia    History reviewed. No pertinent past surgical history. Family History  Problem Relation Age of Onset  . Heart disease     History  Substance Use Topics  . Smoking status: Never Smoker   . Smokeless tobacco: Not on file  . Alcohol Use: No   OB History    No data available     Review of Systems  10 systems reviewed and found to be negative, except as noted in the HPI.  Allergies  Codeine  Home Medications   Prior to Admission medications   Medication Sig Start Date End Date Taking? Authorizing Provider  acetaminophen (TYLENOL) 325 MG tablet Take 2 tablets (650 mg total) by mouth every 6 (six) hours as needed. 04/10/14   Harvie Heck, PA-C  aspirin 81 MG tablet Take 81 mg by mouth daily.    Historical Provider, MD  benazepril-hydrochlorthiazide (LOTENSIN HCT) 20-12.5 MG per tablet Take 1 tablet by mouth daily.    Historical Provider, MD  canagliflozin (INVOKANA) 300 MG TABS tablet Take 150 mg by mouth daily before breakfast.    Historical Provider, MD  digoxin (LANOXIN) 0.125 MG tablet Take 0.125 mg by mouth daily.    Historical Provider, MD  folic acid (FOLVITE) 854 MCG tablet Take 400 mcg  by mouth daily.    Historical Provider, MD  glipiZIDE (GLUCOTROL XL) 10 MG 24 hr tablet Take 10 mg by mouth daily.    Historical Provider, MD  HUMALOG MIX 75/25 KWIKPEN (75-25) 100 UNIT/ML SUPN INJECT 30 UNITS IN THE MORNING AND 17 UNITS IN THE EVENING Patient taking differently: INJECT 42 UNITS IN THE MORNING AND 17 UNITS IN THE EVENING 11/07/12   Lauree Chandler, NP  iron polysaccharides (FERREX 150) 150 MG capsule Take 150 mg by mouth 2 (two) times daily.    Historical Provider, MD  metoprolol succinate (TOPROL-XL) 50 MG 24 hr tablet Take 50 mg by mouth daily. Take with or immediately following a meal.    Historical Provider, MD  simvastatin (ZOCOR) 40 MG tablet Take 40 mg by mouth every evening.    Historical Provider, MD  traMADol (ULTRAM) 50 MG tablet Take 1 tablet (50 mg total) by mouth every 6 (six) hours as needed. 04/10/14   Lauren Parker, PA-C   BP 158/73 mmHg  Pulse 102  Temp(Src) 97.9 F (36.6 C) (Oral)  Resp 20  SpO2 100% Physical Exam  Constitutional: She is oriented to person, place, and time. She appears well-developed and well-nourished. No distress.  HENT:  Head: Normocephalic.  Eyes: Conjunctivae and EOM are normal. Pupils are equal, round, and reactive to light.  Neck: Normal range of motion.  Cardiovascular: Normal rate, regular rhythm and intact distal pulses.   Pulmonary/Chest: Effort normal and  breath sounds normal. No stridor. No respiratory distress. She has no wheezes. She has no rales. She exhibits no tenderness.  Abdominal: Soft. Bowel sounds are normal. She exhibits no distension and no mass. There is tenderness. There is no rebound and no guarding.  Mild focal left lower quadrant pain with no guarding or rebound.  Musculoskeletal: Normal range of motion.  Neurological: She is alert and oriented to person, place, and time.  Psychiatric: She has a normal mood and affect.  Nursing note and vitals reviewed.   ED Course  Procedures (including critical care  time) Labs Review Labs Reviewed - No data to display  Imaging Review No results found.   EKG Interpretation None      MDM   Final diagnoses:  LLQ abdominal pain    Filed Vitals:   06/12/14 1525  BP: 158/73  Pulse: 102  Temp: 97.9 F (36.6 C)  TempSrc: Oral  Resp: 20  SpO2: 100%    Medications  acetaminophen (TYLENOL) tablet 1,000 mg (1,000 mg Oral Given 06/12/14 1636)  sodium chloride 0.9 % bolus 500 mL (0 mLs Intravenous Stopped 06/12/14 1822)  iohexol (OMNIPAQUE) 300 MG/ML solution 100 mL (100 mLs Intravenous Contrast Given 06/12/14 1739)    Kristi Gonzalez is a pleasant 69 y.o. female presenting with isolated left pelvic pain. Abdominal exam is benign. No hernias appreciated. Blood work is reassuring, CT with no acute abnormalities. Offered patient pain medication she has consistently declined. Patient tolerating by mouth and pain is improved in the ED, amenable to discharge. Advised her to follow closely with her primary care physician make them aware that her digoxin level is low. She states she's been compliant with this medication.  This is a shared visit with the attending physician who personally evaluated the patient and agrees with the care plan.   Evaluation does not show pathology that would require ongoing emergent intervention or inpatient treatment. Pt is hemodynamically stable and mentating appropriately. Discussed findings and plan with patient/guardian, who agrees with care plan. All questions answered. Return precautions discussed and outpatient follow up given.        Monico Blitz, PA-C 06/13/14 0018  Wandra Arthurs, MD 06/16/14 [phone]

## 2014-06-12 NOTE — Discharge Instructions (Signed)
Take acetaminophen (Tylenol) up to 975 mg (this is normally 3 over-the-counter pills) up to 3 times a day. Do not drink alcohol. Make sure your other medications do not contain acetaminophen (Read the labels!)  Please follow with your primary care doctor in the next 2 days for a check-up. They must obtain records for further management.   Do not hesitate to return to the Emergency Department for any new, worsening or concerning symptoms.    Abdominal Pain, Women Abdominal (stomach, pelvic, or belly) pain can be caused by many things. It is important to tell your doctor:  The location of the pain.  Does it come and go or is it present all the time?  Are there things that start the pain (eating certain foods, exercise)?  Are there other symptoms associated with the pain (fever, nausea, vomiting, diarrhea)? All of this is helpful to know when trying to find the cause of the pain. CAUSES   Stomach: virus or bacteria infection, or ulcer.  Intestine: appendicitis (inflamed appendix), regional ileitis (Crohn's disease), ulcerative colitis (inflamed colon), irritable bowel syndrome, diverticulitis (inflamed diverticulum of the colon), or cancer of the stomach or intestine.  Gallbladder disease or stones in the gallbladder.  Kidney disease, kidney stones, or infection.  Pancreas infection or cancer.  Fibromyalgia (pain disorder).  Diseases of the female organs:  Uterus: fibroid (non-cancerous) tumors or infection.  Fallopian tubes: infection or tubal pregnancy.  Ovary: cysts or tumors.  Pelvic adhesions (scar tissue).  Endometriosis (uterus lining tissue growing in the pelvis and on the pelvic organs).  Pelvic congestion syndrome (female organs filling up with blood just before the menstrual period).  Pain with the menstrual period.  Pain with ovulation (producing an egg).  Pain with an IUD (intrauterine device, birth control) in the uterus.  Cancer of the female  organs.  Functional pain (pain not caused by a disease, may improve without treatment).  Psychological pain.  Depression. DIAGNOSIS  Your doctor will decide the seriousness of your pain by doing an examination.  Blood tests.  X-rays.  Ultrasound.  CT scan (computed tomography, special type of X-ray).  MRI (magnetic resonance imaging).  Cultures, for infection.  Barium enema (dye inserted in the large intestine, to better view it with X-rays).  Colonoscopy (looking in intestine with a lighted tube).  Laparoscopy (minor surgery, looking in abdomen with a lighted tube).  Major abdominal exploratory surgery (looking in abdomen with a large incision). TREATMENT  The treatment will depend on the cause of the pain.   Many cases can be observed and treated at home.  Over-the-counter medicines recommended by your caregiver.  Prescription medicine.  Antibiotics, for infection.  Birth control pills, for painful periods or for ovulation pain.  Hormone treatment, for endometriosis.  Nerve blocking injections.  Physical therapy.  Antidepressants.  Counseling with a psychologist or psychiatrist.  Minor or major surgery. HOME CARE INSTRUCTIONS   Do not take laxatives, unless directed by your caregiver.  Take over-the-counter pain medicine only if ordered by your caregiver. Do not take aspirin because it can cause an upset stomach or bleeding.  Try a clear liquid diet (broth or water) as ordered by your caregiver. Slowly move to a bland diet, as tolerated, if the pain is related to the stomach or intestine.  Have a thermometer and take your temperature several times a day, and record it.  Bed rest and sleep, if it helps the pain.  Avoid sexual intercourse, if it causes pain.  Avoid stressful  situations.  Keep your follow-up appointments and tests, as your caregiver orders.  If the pain does not go away with medicine or surgery, you may  try:  Acupuncture.  Relaxation exercises (yoga, meditation).  Group therapy.  Counseling. SEEK MEDICAL CARE IF:   You notice certain foods cause stomach pain.  Your home care treatment is not helping your pain.  You need stronger pain medicine.  You want your IUD removed.  You feel faint or lightheaded.  You develop nausea and vomiting.  You develop a rash.  You are having side effects or an allergy to your medicine. SEEK IMMEDIATE MEDICAL CARE IF:   Your pain does not go away or gets worse.  You have a fever.  Your pain is felt only in portions of the abdomen. The right side could possibly be appendicitis. The left lower portion of the abdomen could be colitis or diverticulitis.  You are passing blood in your stools (bright red or black tarry stools, with or without vomiting).  You have blood in your urine.  You develop chills, with or without a fever.  You pass out. MAKE SURE YOU:   Understand these instructions.  Will watch your condition.  Will get help right away if you are not doing well or get worse. Document Released: 01/23/2007 Document Revised: 08/12/2013 Document Reviewed: 02/12/2009 [facility] Patient Information 2015 [place], [place]. This information is not intended to replace advice given to you by your health care provider. Make sure you discuss any questions you have with your health care provider.

## 2014-06-12 NOTE — ED Notes (Signed)
Pt reports sharp shooting left pelvic pain 7/10 radiating to left leg, onset 2 days ago, denies trauma of any kind including fall. Denies also being blood thinner or recent surgical procedures.

## 2014-11-14 ENCOUNTER — Encounter: Payer: Self-pay | Admitting: Internal Medicine

## 2016-04-04 IMAGING — CT CT ABD-PELV W/ CM
1 of 3 series · 13 of 32 positions shown, 18 images · IV contrast (OMNIPAQUE 300)
Comparison: Acute abdominal series done 03/15/2009.

CLINICAL DATA: Sharp shooting left pelvic pain extending into the
leg for 2 days. No acute injury. No recent injury or surgical
procedure. Initial encounter.

EXAM:
CT ABDOMEN AND PELVIS WITH CONTRAST
TECHNIQUE: Multidetector CT imaging of the abdomen and pelvis was performed
using the standard protocol following bolus administration of
intravenous contrast.
CONTRAST:  100mL OMNIPAQUE IOHEXOL 300 MG/ML  SOLN

[Series 2: abd/pel with · axial · 0.72mm/px · z∈[+1012,+1382]mm · 13 of 84 slices shown, 18 images]
[im 5/84  soft-tissue]
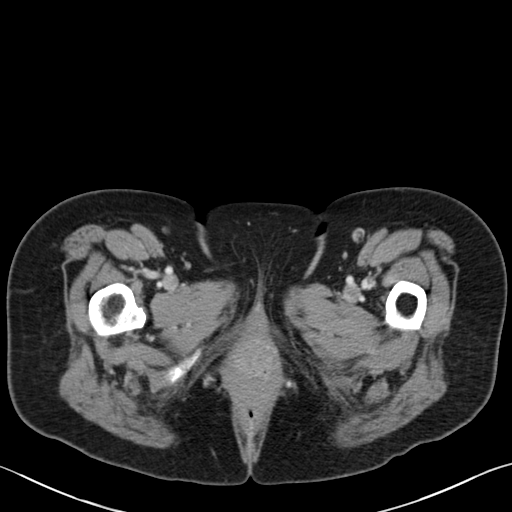
[im 5/84  bone]
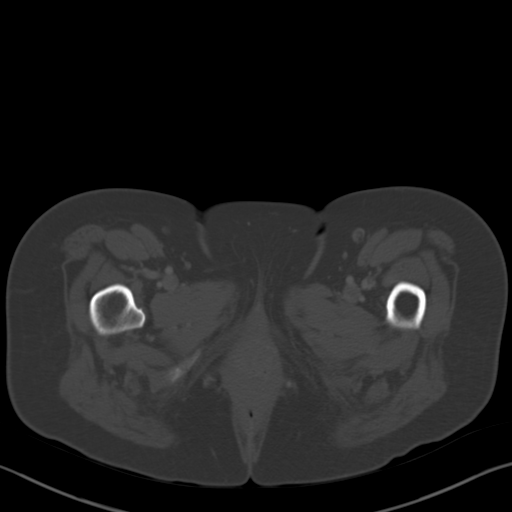
[im 13/84  soft-tissue]
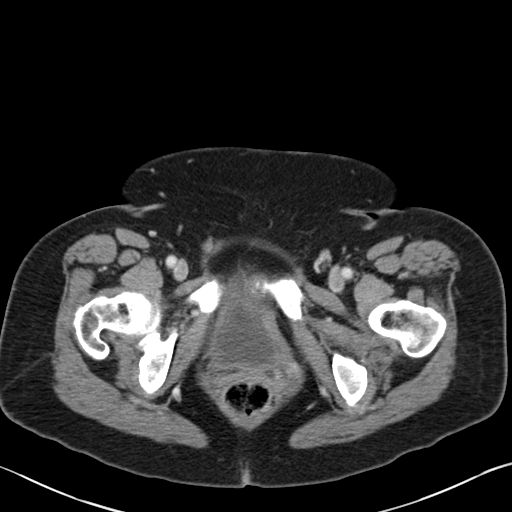
[im 17/84  soft-tissue]
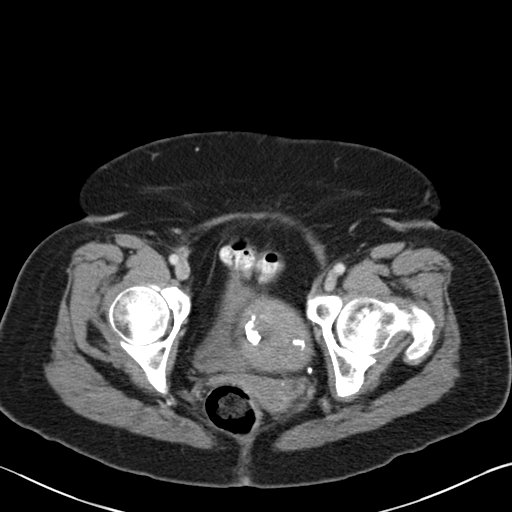
[im 25/84  soft-tissue]
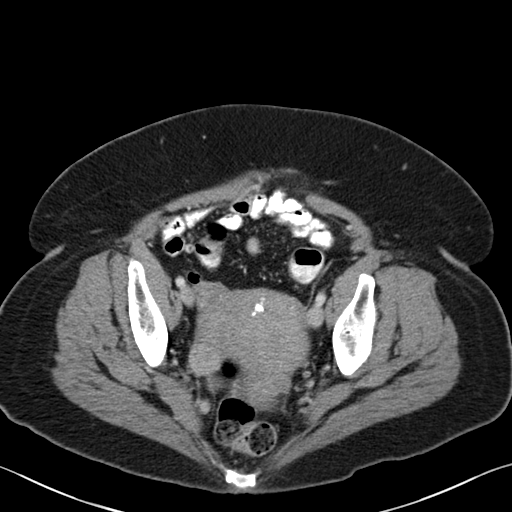
[im 34/84  soft-tissue]
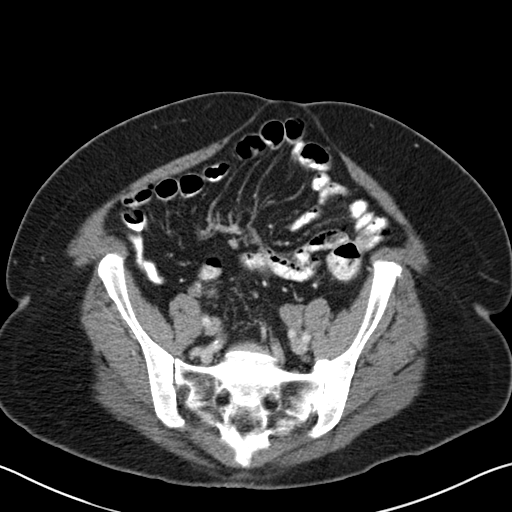
[im 38/84  soft-tissue]
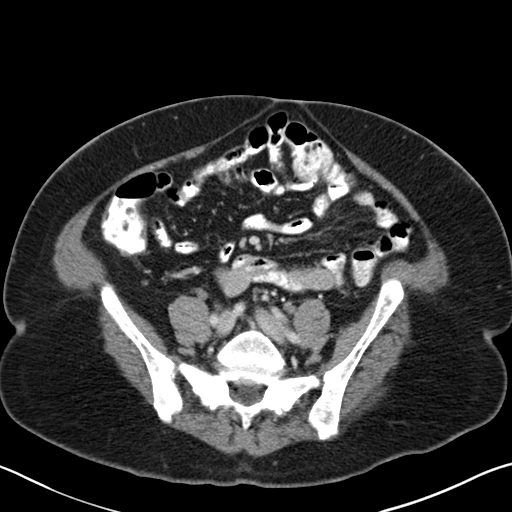
[im 46/84  soft-tissue]
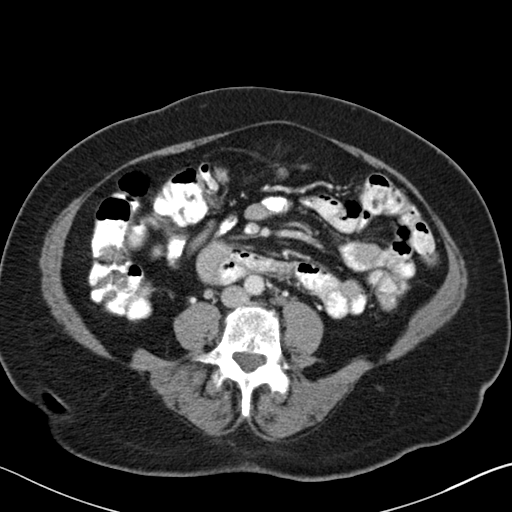
[im 50/84  soft-tissue]
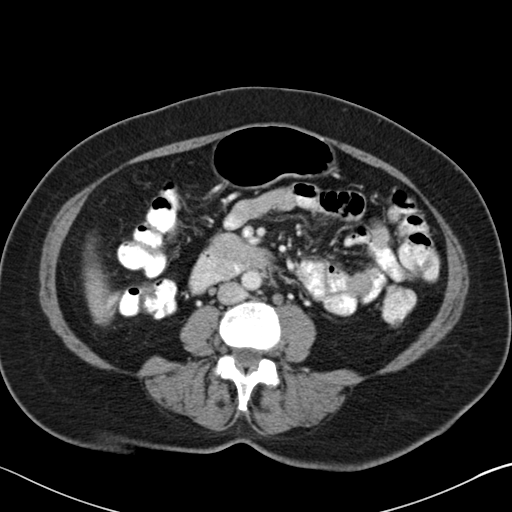
[im 59/84  soft-tissue]
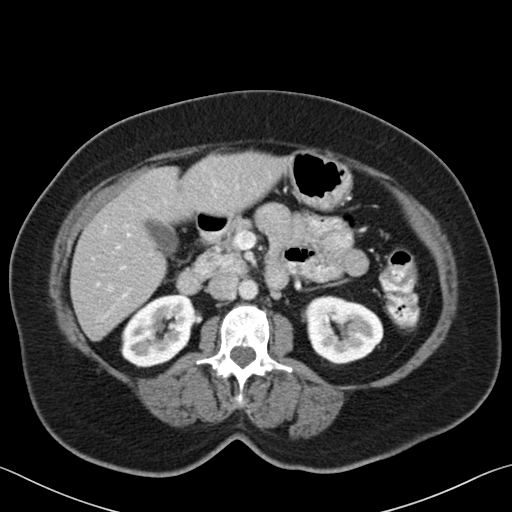
[im 59/84  bone]
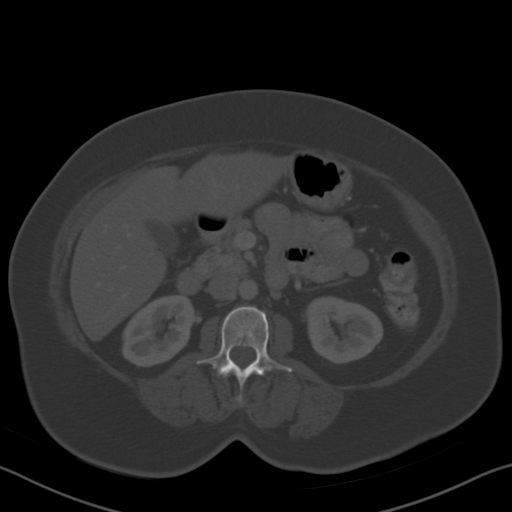
[im 67/84  soft-tissue]
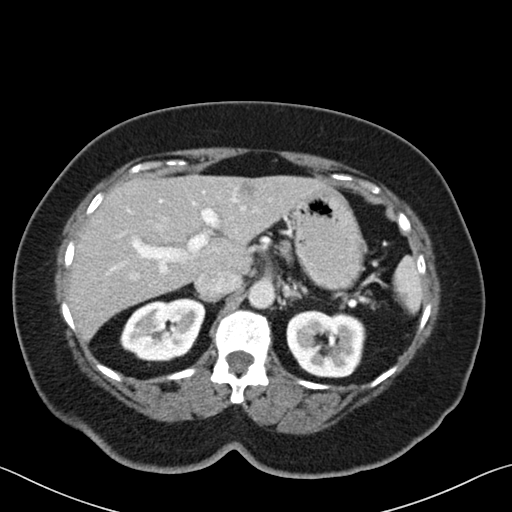
[im 67/84  lung]
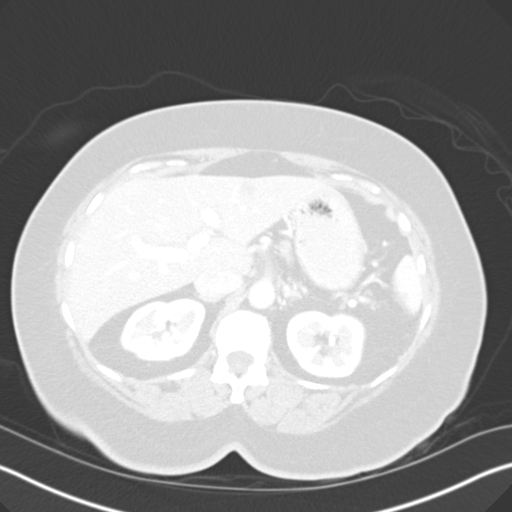
[im 71/84  soft-tissue]
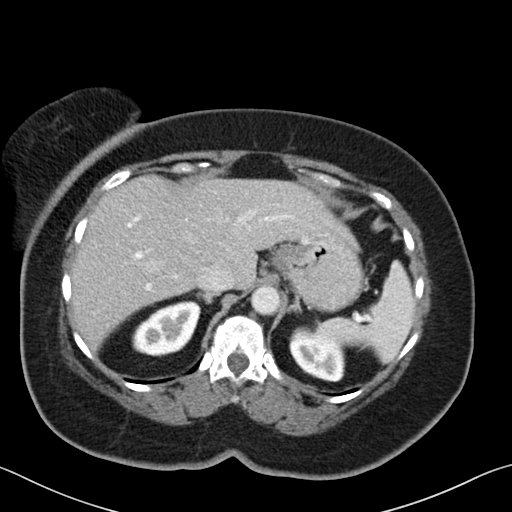
[im 71/84  lung]
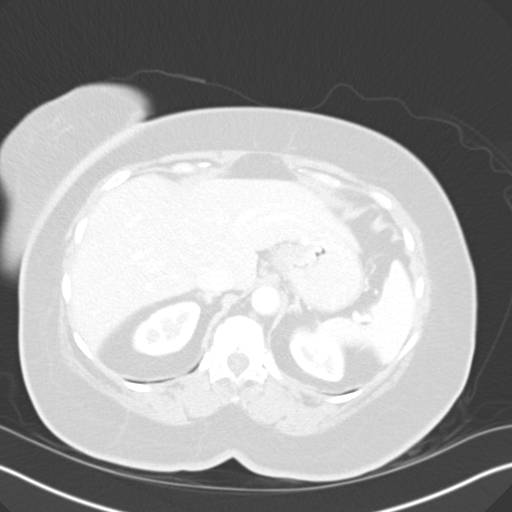
[im 75/84  lung]
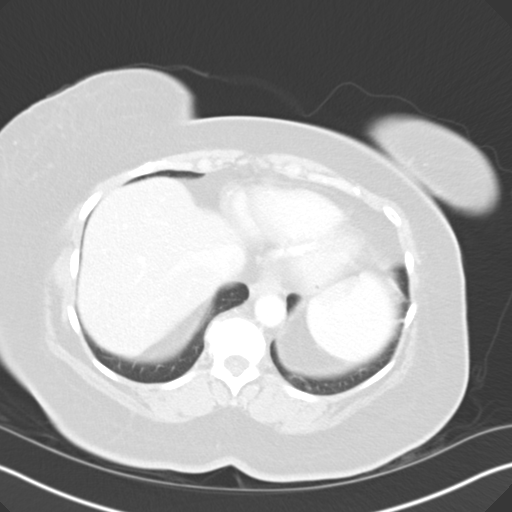
[im 79/84  soft-tissue]
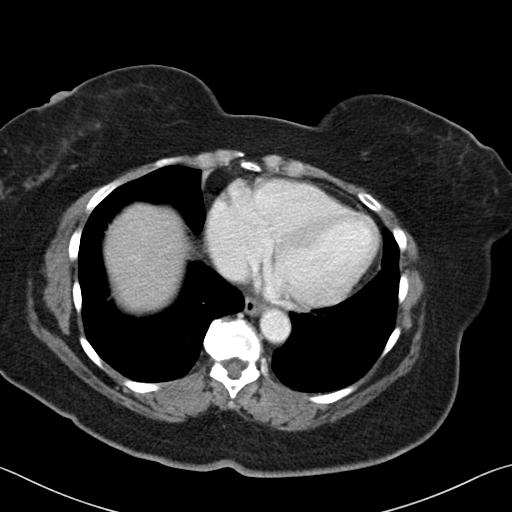
[im 79/84  lung]
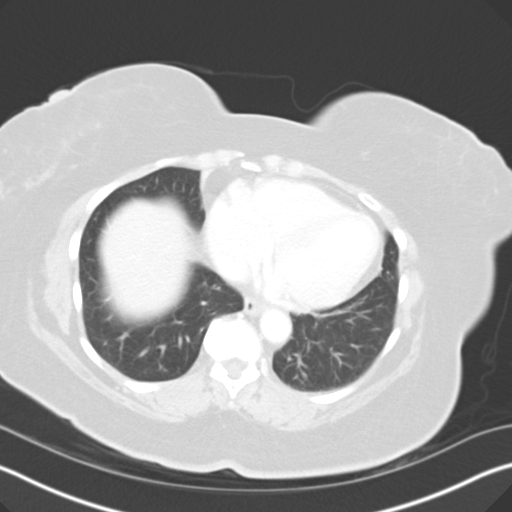

[13 of 32 positions shown; findings below may reference images not displayed]

FINDINGS: Lower chest: Clear lung bases. No significant pleural or pericardial
effusion.

Hepatobiliary: 1.7 cm low-density lesion in the left lobe with
peripheral enhancement on image 19 is not well seen on the delayed
images, likely a hemangioma. The liver otherwise appears
unremarkable without suspicious lesion. No evidence of gallstones,
gallbladder wall thickening or biliary dilatation.

Pancreas: Unremarkable. No pancreatic ductal dilatation or
surrounding inflammatory changes.

Spleen: Normal in size without focal abnormality.

Adrenals/Urinary Tract: Both adrenal glands appear normal.Both
kidneys appear normal without evidence of urinary tract calculus,
mass or hydronephrosis. There is mild bladder wall thickening which
may be secondary to incomplete distention.

Stomach/Bowel: No evidence of bowel wall thickening, distention or
surrounding inflammatory change.No ascites or focal extraluminal
fluid collection. The appendix appears normal.

Vascular/Lymphatic: There are no enlarged abdominal or pelvic lymph
nodes. No significant vascular findings are present.

Reproductive: The uterus is moderately enlarged by multiple
partially calcified fibroids. No evidence of adnexal mass or pelvic
inflammatory process.

Other: There is a broad-based umbilical hernia without evidence of
incarceration or obstruction.

Musculoskeletal: No acute or significant osseous findings. There are
mild degenerative changes within the lumbar spine, sacroiliac joints
and symphysis pubis.
IMPRESSION: 1. No acute findings or explanation for the patient's symptoms
identified. There is no evidence of hydronephrosis or urinary tract
calculus.
2. Uterine fibroids.
3. Probable hemangioma within the left hepatic lobe.

## 2018-02-22 ENCOUNTER — Other Ambulatory Visit: Payer: Self-pay | Admitting: Endocrinology

## 2018-02-22 DIAGNOSIS — Z1231 Encounter for screening mammogram for malignant neoplasm of breast: Secondary | ICD-10-CM

## 2018-04-18 ENCOUNTER — Ambulatory Visit: Payer: Medicare HMO

## 2019-12-06 ENCOUNTER — Other Ambulatory Visit: Payer: Self-pay | Admitting: Critical Care Medicine

## 2019-12-06 ENCOUNTER — Other Ambulatory Visit: Payer: Medicare Other

## 2019-12-06 DIAGNOSIS — Z20822 Contact with and (suspected) exposure to covid-19: Secondary | ICD-10-CM

## 2019-12-08 LAB — SARS-COV-2, NAA 2 DAY TAT

## 2019-12-09 ENCOUNTER — Telehealth: Payer: Self-pay | Admitting: Adult Health

## 2019-12-09 NOTE — Telephone Encounter (Signed)
Called and LMOM regarding monoclonal antibody treatment for COVID 19 given to those who are at risk for complications and/or hospitalization of the virus.  Patient meets criteria based on: age, diabetes  Call back number given: [phone]  My chart message: unable to send  Wilber Bihari, NP

## 2019-12-13 LAB — NOVEL CORONAVIRUS, NAA: SARS-CoV-2, NAA: DETECTED — AB

## 2019-12-20 ENCOUNTER — Other Ambulatory Visit: Payer: Medicare Other

## 2019-12-20 ENCOUNTER — Other Ambulatory Visit: Payer: Self-pay | Admitting: Sleep Medicine

## 2019-12-20 DIAGNOSIS — I471 Supraventricular tachycardia: Secondary | ICD-10-CM

## 2019-12-23 ENCOUNTER — Ambulatory Visit: Payer: Self-pay | Admitting: *Deleted

## 2019-12-23 LAB — NOVEL CORONAVIRUS, NAA: SARS-CoV-2, NAA: NOT DETECTED

## 2019-12-23 NOTE — Telephone Encounter (Signed)
Patient requesting covid results and results still pending. Patient verbalized understanding.

## 2019-12-23 NOTE — Telephone Encounter (Signed)
Patient requesting covid lab results. Results still pending at this time. Patient verbalized understanding .

## 2021-03-10 ENCOUNTER — Emergency Department (HOSPITAL_COMMUNITY): Payer: Medicare Other

## 2021-03-10 ENCOUNTER — Encounter (HOSPITAL_COMMUNITY): Payer: Self-pay

## 2021-03-10 ENCOUNTER — Other Ambulatory Visit: Payer: Self-pay

## 2021-03-10 ENCOUNTER — Inpatient Hospital Stay (HOSPITAL_COMMUNITY)
Admission: EM | Admit: 2021-03-10 | Discharge: 2021-03-13 | DRG: 194 | Disposition: A | Discharge status: Home / Self Care | Payer: Medicare Other | Attending: Internal Medicine | Admitting: Internal Medicine

## 2021-03-10 DIAGNOSIS — Z7984 Long term (current) use of oral hypoglycemic drugs: Secondary | ICD-10-CM | POA: Diagnosis not present

## 2021-03-10 DIAGNOSIS — Z20822 Contact with and (suspected) exposure to covid-19: Secondary | ICD-10-CM | POA: Diagnosis present

## 2021-03-10 DIAGNOSIS — J101 Influenza due to other identified influenza virus with other respiratory manifestations: Principal | ICD-10-CM | POA: Diagnosis present

## 2021-03-10 DIAGNOSIS — Z79899 Other long term (current) drug therapy: Secondary | ICD-10-CM

## 2021-03-10 DIAGNOSIS — R55 Syncope and collapse: Secondary | ICD-10-CM | POA: Diagnosis present

## 2021-03-10 DIAGNOSIS — Z7982 Long term (current) use of aspirin: Secondary | ICD-10-CM | POA: Diagnosis not present

## 2021-03-10 DIAGNOSIS — N179 Acute kidney failure, unspecified: Secondary | ICD-10-CM | POA: Diagnosis present

## 2021-03-10 DIAGNOSIS — I10 Essential (primary) hypertension: Secondary | ICD-10-CM | POA: Diagnosis present

## 2021-03-10 DIAGNOSIS — N289 Disorder of kidney and ureter, unspecified: Secondary | ICD-10-CM | POA: Diagnosis not present

## 2021-03-10 DIAGNOSIS — E861 Hypovolemia: Secondary | ICD-10-CM | POA: Diagnosis present

## 2021-03-10 DIAGNOSIS — E860 Dehydration: Secondary | ICD-10-CM | POA: Diagnosis present

## 2021-03-10 DIAGNOSIS — E119 Type 2 diabetes mellitus without complications: Secondary | ICD-10-CM | POA: Diagnosis not present

## 2021-03-10 DIAGNOSIS — E0781 Sick-euthyroid syndrome: Secondary | ICD-10-CM | POA: Diagnosis present

## 2021-03-10 DIAGNOSIS — E871 Hypo-osmolality and hyponatremia: Principal | ICD-10-CM | POA: Diagnosis present

## 2021-03-10 DIAGNOSIS — D509 Iron deficiency anemia, unspecified: Secondary | ICD-10-CM | POA: Diagnosis present

## 2021-03-10 DIAGNOSIS — I480 Paroxysmal atrial fibrillation: Secondary | ICD-10-CM | POA: Diagnosis present

## 2021-03-10 DIAGNOSIS — Z885 Allergy status to narcotic agent status: Secondary | ICD-10-CM | POA: Diagnosis not present

## 2021-03-10 DIAGNOSIS — Z794 Long term (current) use of insulin: Secondary | ICD-10-CM | POA: Diagnosis not present

## 2021-03-10 DIAGNOSIS — E785 Hyperlipidemia, unspecified: Secondary | ICD-10-CM | POA: Diagnosis present

## 2021-03-10 LAB — CBC WITH DIFFERENTIAL/PLATELET
Abs Immature Granulocytes: 0.02 10*3/uL (ref 0.00–0.07)
Basophils Absolute: 0 10*3/uL (ref 0.0–0.1)
Basophils Relative: 0 %
Eosinophils Absolute: 0 10*3/uL (ref 0.0–0.5)
Eosinophils Relative: 0 %
HCT: 32.9 % — ABNORMAL LOW (ref 36.0–46.0)
Hemoglobin: 10.4 g/dL — ABNORMAL LOW (ref 12.0–15.0)
Immature Granulocytes: 0 %
Lymphocytes Relative: 12 %
Lymphs Abs: 0.9 10*3/uL (ref 0.7–4.0)
MCH: 26.4 pg (ref 26.0–34.0)
MCHC: 31.6 g/dL (ref 30.0–36.0)
MCV: 83.5 fL (ref 80.0–100.0)
Monocytes Absolute: 0.5 10*3/uL (ref 0.1–1.0)
Monocytes Relative: 7 %
Neutro Abs: 5.7 10*3/uL (ref 1.7–7.7)
Neutrophils Relative %: 81 %
Platelets: 206 10*3/uL (ref 150–400)
RBC: 3.94 MIL/uL (ref 3.87–5.11)
RDW: 13.2 % (ref 11.5–15.5)
WBC: 7.1 10*3/uL (ref 4.0–10.5)
nRBC: 0 % (ref 0.0–0.2)

## 2021-03-10 LAB — COMPREHENSIVE METABOLIC PANEL
ALT: 14 U/L (ref 0–44)
AST: 29 U/L (ref 15–41)
Albumin: 3.7 g/dL (ref 3.5–5.0)
Alkaline Phosphatase: 60 U/L (ref 38–126)
Anion gap: 11 (ref 5–15)
BUN: 16 mg/dL (ref 8–23)
CO2: 24 mmol/L (ref 22–32)
Calcium: 8.8 mg/dL — ABNORMAL LOW (ref 8.9–10.3)
Chloride: 88 mmol/L — ABNORMAL LOW (ref 98–111)
Creatinine, Ser: 1.12 mg/dL — ABNORMAL HIGH (ref 0.44–1.00)
GFR, Estimated: 51 mL/min — ABNORMAL LOW (ref >60)
Glucose, Bld: 221 mg/dL — ABNORMAL HIGH (ref 70–99)
Potassium: 4 mmol/L (ref 3.5–5.1)
Sodium: 123 mmol/L — ABNORMAL LOW (ref 135–145)
Total Bilirubin: 0.6 mg/dL (ref 0.3–1.2)
Total Protein: 7.8 g/dL (ref 6.5–8.1)

## 2021-03-10 LAB — MAGNESIUM: Magnesium: 1.5 mg/dL — ABNORMAL LOW (ref 1.7–2.4)

## 2021-03-10 LAB — LIPASE, BLOOD: Lipase: 42 U/L (ref 11–51)

## 2021-03-10 LAB — RESP PANEL BY RT-PCR (FLU A&B, COVID) ARPGX2
Influenza A by PCR: POSITIVE — AB
Influenza B by PCR: NEGATIVE
SARS Coronavirus 2 by RT PCR: NEGATIVE

## 2021-03-10 LAB — CBG MONITORING, ED: Glucose-Capillary: 223 mg/dL — ABNORMAL HIGH (ref 70–99)

## 2021-03-10 LAB — CK: Total CK: 229 U/L (ref 38–234)

## 2021-03-10 MED ORDER — MAGNESIUM SULFATE 2 GM/50ML IV SOLN
2.0000 g | Freq: Once | INTRAVENOUS | Status: AC
  Administered 2021-03-10: 2 g via INTRAVENOUS
  Filled 2021-03-10: qty 50

## 2021-03-10 MED ORDER — SODIUM CHLORIDE 0.9 % IV BOLUS
1000.0000 mL | Freq: Once | INTRAVENOUS | Status: AC
  Administered 2021-03-10: 1000 mL via INTRAVENOUS

## 2021-03-10 MED ORDER — ACETAMINOPHEN 325 MG PO TABS
650.0000 mg | ORAL_TABLET | Freq: Once | ORAL | Status: AC
  Administered 2021-03-10: 650 mg via ORAL
  Filled 2021-03-10: qty 2

## 2021-03-10 MED ORDER — SODIUM CHLORIDE 0.9 % IV BOLUS: 500.0000 mL | Freq: Once | INTRAVENOUS | Status: DC

## 2021-03-10 MED ORDER — OSELTAMIVIR PHOSPHATE 30 MG PO CAPS
30.0000 mg | ORAL_CAPSULE | Freq: Two times a day (BID) | ORAL | Status: DC
  Administered 2021-03-11 – 2021-03-13 (×6): 30 mg via ORAL
  Filled 2021-03-10 (×7): qty 1

## 2021-03-10 MED ORDER — ONDANSETRON 4 MG PO TBDP
4.0000 mg | ORAL_TABLET | Freq: Once | ORAL | Status: AC
  Administered 2021-03-10: 4 mg via ORAL
  Filled 2021-03-10: qty 1

## 2021-03-10 NOTE — ED Triage Notes (Signed)
Pt states she had a LOC this morning, pt was alone and unsure how long she was out for. Pt here with family and stated on the way here she had an episode of emesis. Pt states she takes a daily aspirin. Pt is diabetic.

## 2021-03-10 NOTE — ED Provider Notes (Signed)
Emergency Medicine Provider Triage Evaluation Note  Kristi Gonzalez , a 75 y.o. female  was evaluated in triage.  Pt reportedly had a syncopal episode earlier today while on the phone.  Reports that she felt dizzy and lightheaded prior to losing consciousness.  Has felt sick all week.  Also with episode of emesis onto her clothing on the way here.  No history of heart arrhythmia, syncope, seizure  Review of Systems  Positive: Nausea, vomiting, syncope Negative: Headache, chest pain or difficulty breathing  Physical Exam  BP (!) 140/112 (BP Location: Left Wrist)   Pulse (!) 104   Temp (!) 101.6 F (38.7 C) (Oral)   Resp 14   Ht 5\' 3"  (1.6 m)   Wt 76.2 kg   SpO2 96%   BMI 29.76 kg/m  Gen:   Awake, no distress   Resp:  Normal effort  MSK:   Moves extremities without difficulty  Other:    Medical Decision Making  Medically screening exam initiated at 8:00 PM.  Appropriate orders placed.  KEILI HASTEN was informed that the remainder of the evaluation will be completed by another provider, this initial triage assessment does not replace that evaluation, and the importance of remaining in the ED until their evaluation is complete.    Febrile and tachycardic in triage.  Tylenol and Zofran ordered.   Darliss Ridgel 03/10/21 2002    Drenda Freeze, MD 03/10/21 [phone]

## 2021-03-10 NOTE — ED Provider Notes (Signed)
Kristi Gonzalez   CSN: [id number] Arrival date & time: 03/10/21  1836     History Chief Complaint  Patient presents with   Loss of Consciousness   Emesis    Kristi Gonzalez is a 75 y.o. female.  HPI 74 year old female with a history of anemia, DM type II, hypertension, paroxysmal A. fib not on anticoagulation presented via EMS after a syncopal episode at home today while on the phone with her daughter.  Unclear amount of downtime.  She felt dizzy and lightheaded prior to the episode.  1 episode of emesis on the way to the ER.  Denies any chest pain, shortness of breath, history of seizures.  Denies any dysuria, abdominal pain.    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 No pertinent surgical history.   OB History   No obstetric history on file.     Family History  Problem Relation Age of Onset   Heart disease Other     Social History   Tobacco Use   Smoking status: Never  Substance Use Topics   Alcohol use: No    Home Medications Prior to Admission medications   Medication Sig Start Date End Date Taking? Authorizing Provider  acetaminophen (TYLENOL) 325 MG tablet Take 2 tablets (650 mg total) by mouth every 6 (six) hours as needed. 04/10/14   Harvie Heck, PA-C  aspirin 81 MG tablet Take 81 mg by mouth daily.    [provider]  benazepril-hydrochlorthiazide (LOTENSIN HCT) 20-12.5 MG per tablet Take 1 tablet by mouth daily.    [provider]  canagliflozin (INVOKANA) 300 MG TABS tablet Take 150 mg by mouth daily before breakfast.    [provider]  folic  acid (FOLVITE) 353 MCG tablet Take 400 mcg by mouth daily.    [provider]  glipiZIDE (GLUCOTROL XL) 10 MG 24 hr tablet Take 10 mg by mouth daily.    [provider]  HUMALOG MIX 75/25 KWIKPEN (75-25) 100 UNIT/ML SUPN INJECT 30 UNITS IN THE MORNING AND 17 UNITS IN THE EVENING Patient not taking: Reported on 06/12/2014 11/07/12   Lauree Chandler, NP  iron polysaccharides (FERREX 150) 150 MG capsule Take 150 mg by mouth daily.     [provider]  LANTUS SOLOSTAR 100 UNIT/ML Solostar Pen Inject 30 Units into the skin every morning. 05/21/14   [provider]  metoprolol succinate (TOPROL-XL) 50 MG 24 hr tablet Take 50 mg by mouth daily. Take with or immediately following a meal.    [provider]  simvastatin (ZOCOR) 40 MG tablet Take 40 mg by mouth every evening.    [provider]  traMADol (ULTRAM) 50 MG tablet Take 1 tablet (50 mg total) by mouth every 6 (six) hours as needed. Patient not taking: Reported on 06/12/2014 04/10/14   Harvie Heck, PA-C    Allergies    Codeine  Review of Systems   Review of Systems Ten systems reviewed and are negative for acute change, except as noted in the HPI.    Physical Exam Updated Vital Signs BP Marland Kitchen)  159/74   Pulse 94   Temp 100.3 F (37.9 C) (Oral)   Resp 16   Ht 5\' 3"  (1.6 m)   Wt 76.2 kg   SpO2 98%   BMI 29.76 kg/m   Physical Exam Vitals and nursing Gonzalez reviewed.  Constitutional:      General: She is not in acute distress.    Appearance: She is well-developed.     Comments: Alert and oriented x3, well-appearing, resting comfortably in the ER bed  HENT:     Head: Normocephalic and atraumatic.  Eyes:     Conjunctiva/sclera: Conjunctivae normal.  Cardiovascular:     Rate and Rhythm: Normal rate and regular rhythm.     Heart sounds: No murmur heard. Pulmonary:     Effort: Pulmonary effort is normal. No respiratory distress.     Breath sounds: Normal breath sounds.   Abdominal:     Palpations: Abdomen is soft.     Tenderness: There is no abdominal tenderness.  Musculoskeletal:        General: No swelling.     Cervical back: Neck supple.  Skin:    General: Skin is warm and dry.     Capillary Refill: Capillary refill takes less than 2 seconds.  Neurological:     Mental Status: She is alert.  Psychiatric:        Mood and Affect: Mood normal.    ED Results / Procedures / Treatments   Labs (all labs ordered are listed, but only abnormal results are displayed) Labs Reviewed  RESP PANEL BY RT-PCR (FLU A&B, COVID) ARPGX2 - Abnormal; Notable for the following components:      Result Value   Influenza A by PCR POSITIVE (*)    All other components within normal limits  COMPREHENSIVE METABOLIC PANEL - Abnormal; Notable for the following components:   Sodium 123 (*)    Chloride 88 (*)    Glucose, Bld 221 (*)    Creatinine, Ser 1.12 (*)    Calcium 8.8 (*)    GFR, Estimated 51 (*)    All other components within normal limits  CBC WITH DIFFERENTIAL/PLATELET - Abnormal; Notable for the following components:   Hemoglobin 10.4 (*)    HCT 32.9 (*)    All other components within normal limits  MAGNESIUM - Abnormal; Notable for the following components:   Magnesium 1.5 (*)    All other components within normal limits  CBG MONITORING, ED - Abnormal; Notable for the following components:   Glucose-Capillary 223 (*)    All other components within normal limits  LIPASE, BLOOD  URINALYSIS, ROUTINE W REFLEX MICROSCOPIC  TSH  CK    EKG EKG Interpretation  Date/Time:  Wednesday March 10 2021 21:08:00 EST Ventricular Rate:  95 PR Interval:  201 QRS Duration: 79 QT Interval:  371 QTC Calculation: 467 R Axis:   -9 Text Interpretation: Sinus rhythm Probable left atrial enlargement Borderline T abnormalities, lateral leads No significant change since last tracing Confirmed by Wandra Arthurs [phone]) on 03/10/2021 11:16:43 PM  Radiology DG Chest 2  View  Result Date: 03/10/2021 CLINICAL DATA:  Shortness of breath EXAM: CHEST - 2 VIEW COMPARISON:  08/27/2013 FINDINGS: Borderline cardiomegaly. No focal opacity, pleural effusion, or pneumothorax. IMPRESSION: No active cardiopulmonary disease.  Borderline to mild cardiomegaly. Electronically Signed   By: Donavan Foil M.D.   On: 03/10/2021 20:44   CT Head Wo Contrast  Result Date: 03/10/2021 CLINICAL DATA:  Syncopal episode.  Diabetic patient. EXAM: CT HEAD  WITHOUT CONTRAST TECHNIQUE: Contiguous axial images were obtained from the base of the skull through the vertex without intravenous contrast. COMPARISON:  MRI brain and CT scan head both 08/27/2013. 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 Again noted is an asymmetric prominent perivascular space in the posterior right basal ganglia. Atrophy and small-vessel changes are slightly progressed from 2015. There is no midline shift. No new asymmetry is seen concerning for acute infarct, hemorrhage or mass. Vascular: There are patchy calcifications in the siphons, distal vertebral arteries, somewhat increased since 2015. There are no hyperdense central vessels. Skull: Normal. Negative for fracture or focal lesion. Sinuses/Orbits: No acute finding. Other: None. IMPRESSION: No acute intracranial CT findings. Chronic changes. Slightly progressive cerebral atrophy and small-vessel disease since 2015, and progressive vascular calcifications. Electronically Signed   By: Telford Nab M.D.   On: 03/10/2021 20:37    Procedures Procedures   Medications Ordered in ED Medications  sodium chloride 0.9 % bolus 1,000 mL (has no administration in time range)  magnesium sulfate IVPB 2 g 50 mL (has no administration in time range)  acetaminophen (TYLENOL) tablet 650  mg (650 mg Oral Given 03/10/21 2045)  ondansetron (ZOFRAN-ODT) disintegrating tablet 4 mg (4 mg Oral Given 03/10/21 2045)    ED Course  I have reviewed the triage vital signs and the nursing notes.  Pertinent labs & imaging results that were available during my care of the patient were reviewed by me and considered in my medical decision making (see chart for details).    MDM Rules/Calculators/A&P                           75 year old female presented with weakness, syncopal episode earlier today.  On arrival, she is well-appearing, no acute distress, resting comfortably in the ER bed.  Alert and oriented x3.  Demented soft and nontender.  Lung sounds clear.  Speaking full sentences without increased work of breathing.  Low-grade temperature of 101.6, improved with Tylenol.  Blood pressure slightly elevated 140/112.  Pertinent lab work includes positive influenza A, hyponatremia of 123, CBG of 223, corrected sodium of 125, no evidence of DKA.  Also evidence of AKI, creatinine 1.12, 0.7.  Baseline magnesium 1.5.  Chest x-ray and CT head unremarkable.  EKG without significant changes from prior.  Given significant hyponatremia, will admit for replacement.  Normal saline bolus initiated, no indication for hypertonic NS as patient is alert and oriented, no known seizures. Magnesium repleted.  Spoke with Dr. Myna Hidalgo who will admit the patient for further evaluation and treatment.  Patient remains hemodynamically stable.  Case discussed with Dr. Darl Householder who is agreeable to the above plan and disposition  Final Clinical Impression(s) / ED Diagnoses Final diagnoses:  Hyponatremia  Influenza A    Rx / DC Orders ED Discharge Orders     None        Lyndel Safe 03/10/21 2327    Drenda Freeze, MD 03/10/21 [phone]

## 2021-03-11 ENCOUNTER — Other Ambulatory Visit (HOSPITAL_COMMUNITY): Payer: Medicare Other

## 2021-03-11 ENCOUNTER — Encounter (HOSPITAL_COMMUNITY): Payer: Self-pay | Admitting: Family Medicine

## 2021-03-11 DIAGNOSIS — E871 Hypo-osmolality and hyponatremia: Secondary | ICD-10-CM | POA: Diagnosis present

## 2021-03-11 DIAGNOSIS — I10 Essential (primary) hypertension: Secondary | ICD-10-CM | POA: Diagnosis present

## 2021-03-11 DIAGNOSIS — N289 Disorder of kidney and ureter, unspecified: Secondary | ICD-10-CM

## 2021-03-11 DIAGNOSIS — R55 Syncope and collapse: Secondary | ICD-10-CM | POA: Diagnosis not present

## 2021-03-11 DIAGNOSIS — J101 Influenza due to other identified influenza virus with other respiratory manifestations: Secondary | ICD-10-CM | POA: Diagnosis present

## 2021-03-11 LAB — CBC
HCT: 30.4 % — ABNORMAL LOW (ref 36.0–46.0)
Hemoglobin: 9.7 g/dL — ABNORMAL LOW (ref 12.0–15.0)
MCH: 26.7 pg (ref 26.0–34.0)
MCHC: 31.9 g/dL (ref 30.0–36.0)
MCV: 83.7 fL (ref 80.0–100.0)
Platelets: 189 10*3/uL (ref 150–400)
RBC: 3.63 MIL/uL — ABNORMAL LOW (ref 3.87–5.11)
RDW: 13.2 % (ref 11.5–15.5)
WBC: 5.8 10*3/uL (ref 4.0–10.5)
nRBC: 0 % (ref 0.0–0.2)

## 2021-03-11 LAB — BASIC METABOLIC PANEL
Anion gap: 8 (ref 5–15)
Anion gap: 10 (ref 5–15)
BUN: 18 mg/dL (ref 8–23)
BUN: 20 mg/dL (ref 8–23)
CO2: 23 mmol/L (ref 22–32)
CO2: 24 mmol/L (ref 22–32)
Calcium: 8.3 mg/dL — ABNORMAL LOW (ref 8.9–10.3)
Calcium: 8.6 mg/dL — ABNORMAL LOW (ref 8.9–10.3)
Chloride: 93 mmol/L — ABNORMAL LOW (ref 98–111)
Chloride: 95 mmol/L — ABNORMAL LOW (ref 98–111)
Creatinine, Ser: 0.98 mg/dL (ref 0.44–1.00)
Creatinine, Ser: 1.1 mg/dL — ABNORMAL HIGH (ref 0.44–1.00)
GFR, Estimated: >60 mL/min (ref >60)
GFR, Estimated: 52 mL/min — ABNORMAL LOW (ref >60)
Glucose, Bld: 168 mg/dL — ABNORMAL HIGH (ref 70–99)
Glucose, Bld: 159 mg/dL — ABNORMAL HIGH (ref 70–99)
Potassium: 3.5 mmol/L (ref 3.5–5.1)
Potassium: 3.6 mmol/L (ref 3.5–5.1)
Sodium: 124 mmol/L — ABNORMAL LOW (ref 135–145)
Sodium: 129 mmol/L — ABNORMAL LOW (ref 135–145)

## 2021-03-11 LAB — URIC ACID: Uric Acid, Serum: 4.9 mg/dL (ref 2.5–7.1)

## 2021-03-11 LAB — URINALYSIS, ROUTINE W REFLEX MICROSCOPIC
Bacteria, UA: NONE SEEN
Bilirubin Urine: NEGATIVE
Glucose, UA: 50 mg/dL — AB
Hgb urine dipstick: NEGATIVE
Ketones, ur: 5 mg/dL — AB
Leukocytes,Ua: NEGATIVE
Nitrite: NEGATIVE
Protein, ur: 30 mg/dL — AB
Specific Gravity, Urine: 1.018 (ref 1.005–1.030)
pH: 5 (ref 5.0–8.0)

## 2021-03-11 LAB — CBG MONITORING, ED
Glucose-Capillary: 167 mg/dL — ABNORMAL HIGH (ref 70–99)
Glucose-Capillary: 179 mg/dL — ABNORMAL HIGH (ref 70–99)
Glucose-Capillary: 158 mg/dL — ABNORMAL HIGH (ref 70–99)

## 2021-03-11 LAB — T4, FREE: Free T4: 0.96 ng/dL (ref 0.61–1.12)

## 2021-03-11 LAB — GLUCOSE, CAPILLARY: Glucose-Capillary: 172 mg/dL — ABNORMAL HIGH (ref 70–99)

## 2021-03-11 LAB — TSH: TSH: 0.259 u[IU]/mL — ABNORMAL LOW (ref 0.350–4.500)

## 2021-03-11 MED ORDER — SODIUM CHLORIDE 0.9 % IV SOLN: INTRAVENOUS | Status: AC

## 2021-03-11 MED ORDER — INSULIN GLARGINE-YFGN 100 UNIT/ML ~~LOC~~ SOLN
20.0000 [IU] | Freq: Every day | SUBCUTANEOUS | Status: DC
  Administered 2021-03-11 – 2021-03-13 (×3): 20 [IU] via SUBCUTANEOUS
  Filled 2021-03-11 (×3): qty 0.2

## 2021-03-11 MED ORDER — SODIUM CHLORIDE 0.9% FLUSH
3.0000 mL | Freq: Two times a day (BID) | INTRAVENOUS | Status: DC
  Administered 2021-03-12 – 2021-03-13 (×2): 3 mL via INTRAVENOUS

## 2021-03-11 MED ORDER — INSULIN ASPART 100 UNIT/ML IJ SOLN
4.0000 [IU] | Freq: Three times a day (TID) | INTRAMUSCULAR | Status: DC
  Administered 2021-03-11 – 2021-03-13 (×7): 4 [IU] via SUBCUTANEOUS
  Filled 2021-03-11: qty 0.04

## 2021-03-11 MED ORDER — ACETAMINOPHEN 650 MG RE SUPP: 650.0000 mg | Freq: Four times a day (QID) | RECTAL | Status: DC | PRN

## 2021-03-11 MED ORDER — ACETAMINOPHEN 325 MG PO TABS: 650.0000 mg | ORAL_TABLET | Freq: Four times a day (QID) | ORAL | Status: DC | PRN

## 2021-03-11 MED ORDER — SENNOSIDES-DOCUSATE SODIUM 8.6-50 MG PO TABS: 1.0000 | ORAL_TABLET | Freq: Every evening | ORAL | Status: DC | PRN

## 2021-03-11 MED ORDER — ENOXAPARIN SODIUM 40 MG/0.4ML IJ SOSY
40.0000 mg | PREFILLED_SYRINGE | INTRAMUSCULAR | Status: DC
  Administered 2021-03-11 – 2021-03-13 (×3): 40 mg via SUBCUTANEOUS
  Filled 2021-03-11 (×3): qty 0.4

## 2021-03-11 MED ORDER — INSULIN ASPART 100 UNIT/ML IJ SOLN
0.0000 [IU] | Freq: Three times a day (TID) | INTRAMUSCULAR | Status: DC
Start: 2021-03-11 — End: 2021-03-13
  Administered 2021-03-11 (×3): 2 [IU] via SUBCUTANEOUS
  Administered 2021-03-12: 3 [IU] via SUBCUTANEOUS
  Administered 2021-03-12 – 2021-03-13 (×3): 1 [IU] via SUBCUTANEOUS
  Filled 2021-03-11: qty 0.09

## 2021-03-11 MED ORDER — INSULIN ASPART 100 UNIT/ML IJ SOLN
0.0000 [IU] | Freq: Every day | INTRAMUSCULAR | Status: DC
Start: 2021-03-11 — End: 2021-03-13
  Filled 2021-03-11: qty 0.05

## 2021-03-11 NOTE — Progress Notes (Addendum)
PROGRESS NOTE    Kristi Gonzalez  ZJI:[id number] DOB: 03/07/1946 DOA: 03/10/2021 PCP: Reynold Bowen, MD   Chief Complain: Passed out, lightheadedness, loss of appetite  Brief Narrative:  Patient is a 75 year old female with history of hypertension, diabetes type 2 who presented to the emergency department with complaint of syncopal episode at home.  She also had poor appetite, nonproductive cough, chills for couple of days, was feeling lightheaded.  She had transient loss of consciousness.  On presentation she was mildly febrile, saturating fine on room air, mild aortic.  Chest x-ray did not show pneumonia.  CT head negative for any acute intracranial abnormality.  Lab work showed sodium of 123, creatinine of 1.12.  Flu was positive. Started on Tamiflu.  Patient admitted for the management of syncope.  Assessment & Plan:   Principal Problem:   Syncope Active Problems:   Insulin-requiring or dependent type II diabetes mellitus (HCC)   ANEMIA, IRON DEFICIENCY, CHRONIC   Influenza A   HTN (hypertension)   Hyponatremia   Mild renal insufficiency   Syncopal episode: In the setting of dehydration from poor oral intake from influenza A.  We will check orthostatics.  Will request PT evaluation when appropriate .continue IV fluids  Hyponatremia:Presented with sodium of 123.  Most likely this is from hypovolemic hyponatremia from dehydration.  She was also on hydrochlorothiazide at home.  Continue IV fluids ,check BMP tomorrow.Check uric acid level  Influenza A: Started on Tamiflu.  Monitor respiratory status.  Currently on room air.  Chest x-ray did not show pneumonia  Mild AKI: Most likely secondary to dehydration.  Continue IV fluids.  Monitor BMP  Insulin-dependent diabetes type 2: Continue sliding-scale insulin.  Monitor blood sugars.  Takes glipizide, insulin at home  Normocytic anemia: Hemoglobin stable in the range of 10.4.  No evidence of bleeding.  Abnormal thyroid function  test: Low TSH.  Will check for free 4 and free T3  Hypertension: Takes metoprolol at home.  Currently blood pressure stable  Hyperlipidemia: Takes simvastatin           DVT prophylaxis:Lovenox Code Status: Full Family Communication: family at bedside Patient status:Inpatient  Dispo: The patient is from: Home              Anticipated d/c is to: Home              Anticipated d/c date FB:[id number]   Consultants: None  Procedures:None  Antimicrobials:  Anti-infectives (From admission, onward)    Start     Dose/Rate Route Frequency Ordered Stop   03/10/21 2345  oseltamivir (TAMIFLU) capsule 30 mg        30 mg Oral 2 times daily 03/10/21 2342 03/15/21 2159       Subjective:  Patient seen and examined at the bedside this morning.  Hemodynamically stable.  Lying on the bed.  Daughter at the bedside.  She says she feels better today.  She does not complain of any shortness of breath, cough or lightheadedness.   Objective: Vitals:   03/11/21 0300 03/11/21 0425 03/11/21 0500 03/11/21 0600  BP: 126/65 (!) 110/58 (!) 115/57 (!) 116/56  Pulse: 87 82 85 85  Resp: 17 18 16 18   Temp:      TempSrc:      SpO2: 91% 92% 96% 96%  Weight:      Height:       No intake or output data in the 24 hours ending 03/11/21 0827 Filed Weights   03/10/21 1917  Weight: 76.2 kg    Examination:  General exam: Overall comfortable, not in distress,obese HEENT: PERRL Respiratory system:  no wheezes or crackles  Cardiovascular system: S1 & S2 heard, RRR.  Gastrointestinal system: Abdomen is nondistended, soft and nontender. Central nervous system: Alert and oriented Extremities: No edema, no clubbing ,no cyanosis Skin: No rashes, no ulcers,no icterus      Data Reviewed: I have personally reviewed following labs and imaging studies  CBC: Recent Labs  Lab 03/10/21 2005 03/11/21 0306  WBC 7.1 5.8  NEUTROABS 5.7  --   HGB 10.4* 9.7*  HCT 32.9* 30.4*  MCV 83.5 83.7  PLT 206 151    Basic Metabolic Panel: Recent Labs  Lab 03/10/21 2005 03/11/21 0306  NA 123* 124*  K 4.0 3.5  CL 88* 93*  CO2 24 23  GLUCOSE 221* 168*  BUN 16 18  CREATININE 1.12* 0.98  CALCIUM 8.8* 8.3*  MG 1.5*  --    GFR: Estimated Creatinine Clearance: 48.5 mL/min (by C-G formula based on SCr of 0.98 mg/dL). Liver Function Tests: Recent Labs  Lab 03/10/21 2005  AST 29  ALT 14  ALKPHOS 60  BILITOT 0.6  PROT 7.8  ALBUMIN 3.7   Recent Labs  Lab 03/10/21 2005  LIPASE 42   No results for input(s): AMMONIA in the last 168 hours. Coagulation Profile: No results for input(s): INR, PROTIME in the last 168 hours. Cardiac Enzymes: Recent Labs  Lab 03/10/21 2005  CKTOTAL 229   BNP (last 3 results) No results for input(s): PROBNP in the last 8760 hours. HbA1C: No results for input(s): HGBA1C in the last 72 hours. CBG: Recent Labs  Lab 03/10/21 2110 03/11/21 0743  GLUCAP 223* 167*   Lipid Profile: No results for input(s): CHOL, HDL, LDLCALC, TRIG, CHOLHDL, LDLDIRECT in the last 72 hours. Thyroid Function Tests: Recent Labs    03/10/21 2313  TSH 0.259*   Anemia Panel: No results for input(s): VITAMINB12, FOLATE, FERRITIN, TIBC, IRON, RETICCTPCT in the last 72 hours. Sepsis Labs: No results for input(s): PROCALCITON, LATICACIDVEN in the last 168 hours.  Recent Results (from the past 240 hour(s))  Resp Panel by RT-PCR (Flu A&B, Covid) Nasopharyngeal Swab     Status: Abnormal   Collection Time: 03/10/21  8:07 PM   Specimen: Nasopharyngeal Swab; Nasopharyngeal(NP) swabs in vial transport medium  Result Value Ref Range Status   SARS Coronavirus 2 by RT PCR NEGATIVE NEGATIVE Final    Comment: (NOTE) SARS-CoV-2 target nucleic acids are NOT DETECTED.  The SARS-CoV-2 RNA is generally detectable in upper respiratory specimens during the acute phase of infection. The lowest concentration of SARS-CoV-2 viral copies this assay can detect is 138 copies/mL. A negative result  does not preclude SARS-Cov-2 infection and should not be used as the sole basis for treatment or other patient management decisions. A negative result may occur with  improper specimen collection/handling, submission of specimen other than nasopharyngeal swab, presence of viral mutation(s) within the areas targeted by this assay, and inadequate number of viral copies(<138 copies/mL). A negative result must be combined with clinical observations, patient history, and epidemiological information. The expected result is Negative.  Fact Sheet for Patients:  EntrepreneurPulse.com.au  Fact Sheet for Healthcare Providers:  IncredibleEmployment.be  This test is no t yet approved or cleared by the [place] FDA and  has been authorized for detection and/or diagnosis of SARS-CoV-2 by FDA under an Emergency Use Authorization (EUA). This EUA will remain  in effect (meaning this test  can be used) for the duration of the COVID-19 declaration under Section 564(b)(1) of the Act, 21 U.S.C.section 360bbb-3(b)(1), unless the authorization is terminated  or revoked sooner.       Influenza A by PCR POSITIVE (A) NEGATIVE Final   Influenza B by PCR NEGATIVE NEGATIVE Final    Comment: (NOTE) The Xpert Xpress SARS-CoV-2/FLU/RSV plus assay is intended as an aid in the diagnosis of influenza from Nasopharyngeal swab specimens and should not be used as a sole basis for treatment. Nasal washings and aspirates are unacceptable for Xpert Xpress SARS-CoV-2/FLU/RSV testing.  Fact Sheet for Patients: EntrepreneurPulse.com.au  Fact Sheet for Healthcare Providers: IncredibleEmployment.be  This test is not yet approved or cleared by the [place] FDA and has been authorized for detection and/or diagnosis of SARS-CoV-2 by FDA under an Emergency Use Authorization (EUA). This EUA will remain in effect (meaning this test can be used)  for the duration of the COVID-19 declaration under Section 564(b)(1) of the Act, 21 U.S.C. section 360bbb-3(b)(1), unless the authorization is terminated or revoked.  Performed at [facility], [place] [address], [place], [place] [address]          Radiology Studies: DG Chest 2 View  Result Date: 03/10/2021 CLINICAL DATA:  Shortness of breath EXAM: CHEST - 2 VIEW COMPARISON:  08/27/2013 FINDINGS: Borderline cardiomegaly. No focal opacity, pleural effusion, or pneumothorax. IMPRESSION: No active cardiopulmonary disease.  Borderline to mild cardiomegaly. Electronically Signed   By: Donavan Foil M.D.   On: 03/10/2021 20:44   CT Head Wo Contrast  Result Date: 03/10/2021 CLINICAL DATA:  Syncopal episode.  Diabetic patient. EXAM: CT HEAD WITHOUT CONTRAST TECHNIQUE: Contiguous axial images were obtained from the base of the skull through the vertex without intravenous contrast. COMPARISON:  MRI brain and CT scan head both 08/27/2013. 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 Again noted is an asymmetric prominent perivascular space in the posterior right basal ganglia. Atrophy and small-vessel changes are slightly progressed from 2015. There is no midline shift. No new asymmetry is seen concerning for acute infarct, hemorrhage or mass. Vascular: There are patchy calcifications in the siphons, distal vertebral arteries, somewhat increased since 2015. There are no hyperdense central vessels. Skull: Normal. Negative for fracture or focal lesion. Sinuses/Orbits: No acute finding. Other: None. IMPRESSION: No acute intracranial CT findings. Chronic changes. Slightly progressive cerebral atrophy and small-vessel disease since 2015, and progressive vascular calcifications. Electronically  Signed   By: Telford Nab M.D.   On: 03/10/2021 20:37        Scheduled Meds:  enoxaparin (LOVENOX) injection  40 mg Subcutaneous Q24H   insulin aspart  0-5 Units Subcutaneous QHS   insulin aspart  0-9 Units Subcutaneous TID WC   insulin aspart  4 Units Subcutaneous TID WC   insulin glargine-yfgn  20 Units Subcutaneous Daily   oseltamivir  30 mg Oral BID   sodium chloride flush  3 mL Intravenous Q12H   Continuous Infusions:  sodium chloride 85 mL/hr at 03/11/21 0310     LOS: 1 day    Time spent: More than 50% of that time was spent in counseling and/or coordination of care.      Shelly Coss, MD [facility] P12/04/2020, 8:27 AM

## 2021-03-11 NOTE — H&P (Signed)
History and Physical    Kristi Gonzalez UQJ:[id number] DOB: Nov 01, 1945 DOA: 03/10/2021  PCP: Reynold Bowen, MD   Patient coming from: Home   Chief Complaint: Passed out, lightheaded, loss of appetite, chills, cough   HPI: Kristi Gonzalez is a pleasant 75 y.o. female with medical history significant for hypertension and diabetes mellitus, now presenting to the emergency department after syncopal episode at home.  Patient reports that she has had poor appetite with nonproductive cough and chills for a couple days, was feeling lightheaded and generally poor, and then had a transient loss of consciousness today.  She denies any chest pain or palpitations.  She denies any leg swelling or tenderness.  ED Course: Upon arrival to the ED, patient is found to be febrile to 38.7 C, saturating well on room air, slightly tachycardic, and with stable blood pressure.  EKG features sinus rhythm with PR interval of 201 ms.  Chest x-ray negative for acute cardiopulmonary disease.  Noncontrast head CT negative for acute intracranial abnormality.  Chemistry panel notable for glucose 221, sodium 123, and creatinine 1.12.  CBC with normocytic anemia.  Influenza A PCR is positive.  The patient was given acetaminophen, IV magnesium, Zofran, and a liter of saline in the ED.  She was started on Tamiflu.  Review of Systems:  All other systems reviewed and apart from HPI, are negative.  Past Medical History:  Diagnosis Date   Anemia    DM (diabetes mellitus) ([place])    HTN (hypertension)    PAF (paroxysmal atrial fibrillation) ([place])     History reviewed. No pertinent surgical history.  Social History:   reports that she has never smoked. She does not have any smokeless tobacco history on file. She reports that she does not drink alcohol. No history on file for drug use.  Allergies  Allergen Reactions   Codeine Itching    Family History  Problem Relation Age of Onset   Heart disease Other      Prior to  Admission medications   Medication Sig Start Date End Date Taking? Authorizing Provider  acetaminophen (TYLENOL) 325 MG tablet Take 2 tablets (650 mg total) by mouth every 6 (six) hours as needed. 04/10/14   Harvie Heck, PA-C  aspirin 81 MG tablet Take 81 mg by mouth daily.    [provider]  benazepril-hydrochlorthiazide (LOTENSIN HCT) 20-12.5 MG per tablet Take 1 tablet by mouth daily.    [provider]  canagliflozin (INVOKANA) 300 MG TABS tablet Take 150 mg by mouth daily before breakfast.    [provider]  folic acid (FOLVITE) 389 MCG tablet Take 400 mcg by mouth daily.    [provider]  glipiZIDE (GLUCOTROL XL) 10 MG 24 hr tablet Take 10 mg by mouth daily.    [provider]  HUMALOG MIX 75/25 KWIKPEN (75-25) 100 UNIT/ML SUPN INJECT 30 UNITS IN THE MORNING AND 17 UNITS IN THE EVENING Patient not taking: Reported on 06/12/2014 11/07/12   Lauree Chandler, NP  iron polysaccharides (FERREX 150) 150 MG capsule Take 150 mg by mouth daily.     [provider]  LANTUS SOLOSTAR 100 UNIT/ML Solostar Pen Inject 30 Units into the skin every morning. 05/21/14   [provider]  metoprolol succinate (TOPROL-XL) 50 MG 24 hr tablet Take 50 mg by mouth daily. Take with or immediately following a meal.    [provider]  simvastatin (ZOCOR) 40 MG tablet Take 40 mg by mouth every evening.  [provider]  traMADol (ULTRAM) 50 MG tablet Take 1 tablet (50 mg total) by mouth every 6 (six) hours as needed. Patient not taking: Reported on 06/12/2014 04/10/14   Harvie Heck, PA-C    Physical Exam: Vitals:   03/10/21 2300 03/11/21 0000 03/11/21 0100 03/11/21 0200  BP: (!) 159/74 (!) 163/74 (!) 147/82 138/68  Pulse: 94 91 90 90  Resp: 16 18 18 16   Temp:      TempSrc:      SpO2: 98% 94% 98% 99%  Weight:      Height:        Constitutional: NAD, calm  Eyes: PERTLA, lids and conjunctivae normal ENMT: Mucous membranes  are moist. Posterior pharynx clear of any exudate or lesions.   Neck: supple, no masses  Respiratory: no wheezing, no crackles. No accessory muscle use.  Cardiovascular: S1 & S2 heard, regular rate and rhythm. No extremity edema.   Abdomen: No distension, no tenderness, soft. Bowel sounds active.  Musculoskeletal: no clubbing / cyanosis. No joint deformity upper and lower extremities.   Skin: no significant rashes, lesions, ulcers. Warm, dry, well-perfused. Neurologic: CN 2-12 grossly intact. Moving all extremities. Alert and oriented.  Psychiatric: Pleasant. Cooperative.    Labs and Imaging on Admission: I have personally reviewed following labs and imaging studies  CBC: Recent Labs  Lab 03/10/21 2005  WBC 7.1  NEUTROABS 5.7  HGB 10.4*  HCT 32.9*  MCV 83.5  PLT 505   Basic Metabolic Panel: Recent Labs  Lab 03/10/21 2005  NA 123*  K 4.0  CL 88*  CO2 24  GLUCOSE 221*  BUN 16  CREATININE 1.12*  CALCIUM 8.8*  MG 1.5*   GFR: Estimated Creatinine Clearance: 42.4 mL/min (A) (by C-G formula based on SCr of 1.12 mg/dL (H)). Liver Function Tests: Recent Labs  Lab 03/10/21 2005  AST 29  ALT 14  ALKPHOS 60  BILITOT 0.6  PROT 7.8  ALBUMIN 3.7   Recent Labs  Lab 03/10/21 2005  LIPASE 42   No results for input(s): AMMONIA in the last 168 hours. Coagulation Profile: No results for input(s): INR, PROTIME in the last 168 hours. Cardiac Enzymes: Recent Labs  Lab 03/10/21 2005  CKTOTAL 229   BNP (last 3 results) No results for input(s): PROBNP in the last 8760 hours. HbA1C: No results for input(s): HGBA1C in the last 72 hours. CBG: Recent Labs  Lab 03/10/21 2110  GLUCAP 223*   Lipid Profile: No results for input(s): CHOL, HDL, LDLCALC, TRIG, CHOLHDL, LDLDIRECT in the last 72 hours. Thyroid Function Tests: Recent Labs    03/10/21 2313  TSH 0.259*   Anemia Panel: No results for input(s): VITAMINB12, FOLATE, FERRITIN, TIBC, IRON, RETICCTPCT in the last 72  hours. Urine analysis:    Component Value Date/Time   COLORURINE YELLOW 03/11/2021 0000   APPEARANCEUR HAZY (A) 03/11/2021 0000   LABSPEC 1.018 03/11/2021 0000   PHURINE 5.0 03/11/2021 0000   GLUCOSEU 50 (A) 03/11/2021 0000   HGBUR NEGATIVE 03/11/2021 0000   BILIRUBINUR NEGATIVE 03/11/2021 0000   KETONESUR 5 (A) 03/11/2021 0000   PROTEINUR 30 (A) 03/11/2021 0000   UROBILINOGEN 1.0 06/12/2014 1709   NITRITE NEGATIVE 03/11/2021 0000   LEUKOCYTESUR NEGATIVE 03/11/2021 0000   Sepsis Labs: @LABRCNTIP (procalcitonin:4,lacticidven:4) ) Recent Results (from the past 240 hour(s))  Resp Panel by RT-PCR (Flu A&B, Covid) Nasopharyngeal Swab     Status: Abnormal   Collection Time: 03/10/21  8:07 PM   Specimen: Nasopharyngeal Swab; Nasopharyngeal(NP) swabs in  vial transport medium  Result Value Ref Range Status   SARS Coronavirus 2 by RT PCR NEGATIVE NEGATIVE Final    Comment: (NOTE) SARS-CoV-2 target nucleic acids are NOT DETECTED.  The SARS-CoV-2 RNA is generally detectable in upper respiratory specimens during the acute phase of infection. The lowest concentration of SARS-CoV-2 viral copies this assay can detect is 138 copies/mL. A negative result does not preclude SARS-Cov-2 infection and should not be used as the sole basis for treatment or other patient management decisions. A negative result may occur with  improper specimen collection/handling, submission of specimen other than nasopharyngeal swab, presence of viral mutation(s) within the areas targeted by this assay, and inadequate number of viral copies(<138 copies/mL). A negative result must be combined with clinical observations, patient history, and epidemiological information. The expected result is Negative.  Fact Sheet for Patients:  EntrepreneurPulse.com.au  Fact Sheet for Healthcare Providers:  IncredibleEmployment.be  This test is no t yet approved or cleared by the [place]  FDA and  has been authorized for detection and/or diagnosis of SARS-CoV-2 by FDA under an Emergency Use Authorization (EUA). This EUA will remain  in effect (meaning this test can be used) for the duration of the COVID-19 declaration under Section 564(b)(1) of the Act, 21 U.S.C.section 360bbb-3(b)(1), unless the authorization is terminated  or revoked sooner.       Influenza A by PCR POSITIVE (A) NEGATIVE Final   Influenza B by PCR NEGATIVE NEGATIVE Final    Comment: (NOTE) The Xpert Xpress SARS-CoV-2/FLU/RSV plus assay is intended as an aid in the diagnosis of influenza from Nasopharyngeal swab specimens and should not be used as a sole basis for treatment. Nasal washings and aspirates are unacceptable for Xpert Xpress SARS-CoV-2/FLU/RSV testing.  Fact Sheet for Patients: EntrepreneurPulse.com.au  Fact Sheet for Healthcare Providers: IncredibleEmployment.be  This test is not yet approved or cleared by the [place] FDA and has been authorized for detection and/or diagnosis of SARS-CoV-2 by FDA under an Emergency Use Authorization (EUA). This EUA will remain in effect (meaning this test can be used) for the duration of the COVID-19 declaration under Section 564(b)(1) of the Act, 21 U.S.C. section 360bbb-3(b)(1), unless the authorization is terminated or revoked.  Performed at [facility], [place] [address], [place], [place] [address]      Radiological Exams on Admission: DG Chest 2 View  Result Date: 03/10/2021 CLINICAL DATA:  Shortness of breath EXAM: CHEST - 2 VIEW COMPARISON:  08/27/2013 FINDINGS: Borderline cardiomegaly. No focal opacity, pleural effusion, or pneumothorax. IMPRESSION: No active cardiopulmonary disease.  Borderline to mild cardiomegaly. Electronically Signed   By: Donavan Foil M.D.   On: 03/10/2021 20:44   CT Head Wo Contrast  Result Date: 03/10/2021 CLINICAL DATA:  Syncopal episode.  Diabetic  patient. EXAM: CT HEAD WITHOUT CONTRAST TECHNIQUE: Contiguous axial images were obtained from the base of the skull through the vertex without intravenous contrast. COMPARISON:  MRI brain and CT scan head both 08/27/2013. 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 Again noted is an asymmetric prominent perivascular space in the posterior right basal ganglia. Atrophy and small-vessel changes are slightly progressed from 2015. There is no midline shift. No new asymmetry is seen concerning for acute infarct, hemorrhage or mass. Vascular: There are patchy calcifications in the siphons, distal vertebral arteries, somewhat increased since 2015. There  are no hyperdense central vessels. Skull: Normal. Negative for fracture or focal lesion. Sinuses/Orbits: No acute finding. Other: None. IMPRESSION: No acute intracranial CT findings. Chronic changes. Slightly progressive cerebral atrophy and small-vessel disease since 2015, and progressive vascular calcifications. Electronically Signed   By: Telford Nab M.D.   On: 03/10/2021 20:37    EKG: Independently reviewed. Sinus rhythm, PR 201 ms.   Assessment/Plan   1. Syncope  - Presents after a syncopal episode at home in setting of influenza A infection and dehydration  - In SR on EKG in ED with PR 201, no high-grade block or prolonged QT  - Check orthostatic vitals, continue cardiac monitoring, continue IVF hydration, check echocardiogram    2. Hyponatremia  - Serum sodium is 123 (corrects to 125 when accounting for hyperglycemia) on admission in setting of hypovolemia and HCTZ use  - Hold HCTZ, continue IVF hydration, monitor serial chem panels    3. Influenza A - Start Tamiflu, continue supportive care    4. Mild renal insufficiency  - Likely acute and  secondary to acute illness and dehydration - Continue IVF hydration, repeat chem panel    5. Insulin-dependent DM  - No recent A1c in EMR  - Continue CBG checks and insulin    6. Anemia  - Hgb 10.4 on admission  - No bleeding, appears to be chronic and stable     DVT prophylaxis: Lovenox  Code Status: Full  Level of Care: Level of care: Telemetry Family Communication: Daughter updated at bedside  Disposition Plan:  Patient is from: Home  Anticipated d/c is to: Home  Anticipated d/c date is: 12/2 or 03/13/21  Patient currently: Pending serial sodium levels, tolerance of adequate oral intake, cardiac monitoring, echocardiogram  Consults called: none  Admission status: Inpatient    Vianne Bulls, MD [facility]  03/11/2021, 2:43 AM

## 2021-03-11 NOTE — ED Notes (Signed)
Pt's eating lunch, unable to do Orthostatic and lab draw at this time.

## 2021-03-12 ENCOUNTER — Inpatient Hospital Stay (HOSPITAL_COMMUNITY): Payer: Medicare Other

## 2021-03-12 DIAGNOSIS — R55 Syncope and collapse: Secondary | ICD-10-CM

## 2021-03-12 LAB — GLUCOSE, CAPILLARY
Glucose-Capillary: 130 mg/dL — ABNORMAL HIGH (ref 70–99)
Glucose-Capillary: 139 mg/dL — ABNORMAL HIGH (ref 70–99)
Glucose-Capillary: 222 mg/dL — ABNORMAL HIGH (ref 70–99)
Glucose-Capillary: 138 mg/dL — ABNORMAL HIGH (ref 70–99)
Glucose-Capillary: 122 mg/dL — ABNORMAL HIGH (ref 70–99)

## 2021-03-12 LAB — CBC
HCT: 30.5 % — ABNORMAL LOW (ref 36.0–46.0)
Hemoglobin: 9.5 g/dL — ABNORMAL LOW (ref 12.0–15.0)
MCH: 26.4 pg (ref 26.0–34.0)
MCHC: 31.1 g/dL (ref 30.0–36.0)
MCV: 84.7 fL (ref 80.0–100.0)
Platelets: 169 10*3/uL (ref 150–400)
RBC: 3.6 MIL/uL — ABNORMAL LOW (ref 3.87–5.11)
RDW: 13.3 % (ref 11.5–15.5)
WBC: 3.7 10*3/uL — ABNORMAL LOW (ref 4.0–10.5)
nRBC: 0 % (ref 0.0–0.2)

## 2021-03-12 LAB — OSMOLALITY, URINE: Osmolality, Ur: 366 mOsm/kg (ref 300–900)

## 2021-03-12 LAB — HEMOGLOBIN A1C
Hgb A1c MFr Bld: 8.5 % — ABNORMAL HIGH (ref 4.8–5.6)
Mean Plasma Glucose: 197 mg/dL

## 2021-03-12 LAB — CORTISOL: Cortisol, Plasma: 12.2 ug/dL

## 2021-03-12 LAB — BASIC METABOLIC PANEL
Anion gap: 4 — ABNORMAL LOW (ref 5–15)
BUN: 18 mg/dL (ref 8–23)
CO2: 23 mmol/L (ref 22–32)
Calcium: 8.3 mg/dL — ABNORMAL LOW (ref 8.9–10.3)
Chloride: 100 mmol/L (ref 98–111)
Creatinine, Ser: 1 mg/dL (ref 0.44–1.00)
GFR, Estimated: 59 mL/min — ABNORMAL LOW (ref >60)
Glucose, Bld: 132 mg/dL — ABNORMAL HIGH (ref 70–99)
Potassium: 3.8 mmol/L (ref 3.5–5.1)
Sodium: 127 mmol/L — ABNORMAL LOW (ref 135–145)

## 2021-03-12 LAB — ECHOCARDIOGRAM COMPLETE
Area-P 1/2: 4.89 cm2
Calc EF: 61.1 %
Height: 63 in
S' Lateral: 2.8 cm
Single Plane A2C EF: 60.6 %
Single Plane A4C EF: 59 %
Weight: 2765.45 oz

## 2021-03-12 LAB — URIC ACID: Uric Acid, Serum: 5.1 mg/dL (ref 2.5–7.1)

## 2021-03-12 LAB — SODIUM, URINE, RANDOM: Sodium, Ur: 80 mmol/L

## 2021-03-12 LAB — OSMOLALITY: Osmolality: 276 mOsm/kg (ref 275–295)

## 2021-03-12 MED ORDER — SODIUM CHLORIDE 0.9 % IV SOLN: INTRAVENOUS | Status: DC

## 2021-03-12 NOTE — Progress Notes (Signed)
PROGRESS NOTE    Kristi Gonzalez  GYI:[id number] DOB: 09-28-1945 DOA: 03/10/2021 PCP: Reynold Bowen, MD   Chief Complain: Passed out, lightheadedness, loss of appetite  Brief Narrative:  Patient is a 75 year old female with history of hypertension, diabetes type 2 who presented to the emergency department with complaint of syncopal episode at home.  She also had poor appetite, nonproductive cough, chills for couple of days, was feeling lightheaded.  She had transient loss of consciousness.  On presentation she was mildly febrile, saturating fine on room air, mild aortic.  Chest x-ray did not show pneumonia.  CT head negative for any acute intracranial abnormality.  Lab work showed sodium of 123, creatinine of 1.12.  Flu was positive. Started on Tamiflu.  Patient admitted for the management of syncope.  Assessment & Plan:   Principal Problem:   Syncope Active Problems:   Insulin-requiring or dependent type II diabetes mellitus (HCC)   ANEMIA, IRON DEFICIENCY, CHRONIC   Influenza A   HTN (hypertension)   Hyponatremia   Mild renal insufficiency   Syncopal episode: In the setting of dehydration from poor oral intake from influenza A.  Orthostatic vital signs negative.  PT consulted.  Still weak.  Hyponatremia:Presented with sodium of 123.  Most likely this is from hypovolemic hyponatremia from dehydration.  She was also on hydrochlorothiazide at home.  Continue IV fluids ,check BMP tomorrow.we are checking SIADH panel  Influenza A: Started on Tamiflu.  Monitor respiratory status.  Currently on room air.  Chest x-ray did not show pneumonia  Mild AKI: Most likely secondary to dehydration.  Continue IV fluids.  Monitor BMP  Insulin-dependent diabetes type 2: Continue sliding-scale insulin.  Monitor blood sugars.  Takes glipizide, insulin at home.  Hemoglobin A1c of 8.5  Normocytic anemia: Hemoglobin stable in the range of 10.4.  No evidence of bleeding.  Abnormal thyroid function  test: Low TSH.  Normal T4.  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  Check TFT in 4 to 6 weeks as an outpatient  Hypertension: Takes metoprolol at home.  Currently blood pressure stable  Hyperlipidemia: Takes simvastatin           DVT prophylaxis:Lovenox Code Status: Full Family Communication: family at bedside Patient status:Inpatient  Dispo: The patient is from: Home              Anticipated d/c is to: Home              Anticipated d/c date [id number] .  Still feeling weak today, waiting for PT evaluation  Consultants: None  Procedures:None  Antimicrobials:  Anti-infectives (From admission, onward)    Start     Dose/Rate Route Frequency Ordered Stop   03/10/21 2345  oseltamivir (TAMIFLU) capsule 30 mg        30 mg Oral 2 times daily 03/10/21 2342 03/15/21 2159       Subjective:  Patient seen and examined at the bedside this morning.  Hemodynamically stable.  She feels comfortable than yesterday but he still has weakness, does not feel ready to go home today.  Objective: Vitals:   03/12/21 0001 03/12/21 0157 03/12/21 0452 03/12/21 0752  BP: 136/76  123/69 110/63  Pulse: (!) 101  96 94  Resp: 18  16 16   Temp: 98.8 F (37.1 C)  99.3 F (37.4 C) 99.1 F (37.3 C)  TempSrc: Oral  Oral Oral  SpO2: 97%  96% 97%  Weight:  78.4 kg    Height:        Intake/Output  Summary (Last 24 hours) at 03/12/2021 1041 Last data filed at 03/12/2021 1000 Gross per 24 hour  Intake 367 ml  Output 1450 ml  Net -1083 ml   Filed Weights   03/10/21 1917 03/11/21 2016 03/12/21 0157  Weight: 76.2 kg 78.4 kg 78.4 kg    Examination:  General exam: Overall comfortable, not in distress,obese HEENT: PERRL Respiratory system:  no wheezes or crackles  Cardiovascular system: S1 & S2 heard, RRR.  Gastrointestinal system: Abdomen is nondistended, soft and nontender. Central nervous system: Alert and oriented Extremities: No edema, no clubbing ,no cyanosis Skin: No rashes, no ulcers,no  icterus      Data Reviewed: I have personally reviewed following labs and imaging studies  CBC: Recent Labs  Lab 03/10/21 2005 03/11/21 0306 03/12/21 0512  WBC 7.1 5.8 3.7*  NEUTROABS 5.7  --   --   HGB 10.4* 9.7* 9.5*  HCT 32.9* 30.4* 30.5*  MCV 83.5 83.7 84.7  PLT 206 189 062   Basic Metabolic Panel: Recent Labs  Lab 03/10/21 2005 03/11/21 0306 03/11/21 1630 03/12/21 0512  NA 123* 124* 129* 127*  K 4.0 3.5 3.6 3.8  CL 88* 93* 95* 100  CO2 24 23 24 23   GLUCOSE 221* 168* 159* 132*  BUN 16 18 20 18   CREATININE 1.12* 0.98 1.10* 1.00  CALCIUM 8.8* 8.3* 8.6* 8.3*  MG 1.5*  --   --   --    GFR: Estimated Creatinine Clearance: 48.2 mL/min (by C-G formula based on SCr of 1 mg/dL). Liver Function Tests: Recent Labs  Lab 03/10/21 2005  AST 29  ALT 14  ALKPHOS 60  BILITOT 0.6  PROT 7.8  ALBUMIN 3.7   Recent Labs  Lab 03/10/21 2005  LIPASE 42   No results for input(s): AMMONIA in the last 168 hours. Coagulation Profile: No results for input(s): INR, PROTIME in the last 168 hours. Cardiac Enzymes: Recent Labs  Lab 03/10/21 2005  CKTOTAL 229   BNP (last 3 results) No results for input(s): PROBNP in the last 8760 hours. HbA1C: Recent Labs    03/11/21 0306  HGBA1C 8.5*   CBG: Recent Labs  Lab 03/11/21 1212 03/11/21 1736 03/11/21 2028 03/12/21 0511 03/12/21 0747  GLUCAP 179* 158* 172* 130* 139*   Lipid Profile: No results for input(s): CHOL, HDL, LDLCALC, TRIG, CHOLHDL, LDLDIRECT in the last 72 hours. Thyroid Function Tests: Recent Labs    03/10/21 2313 03/11/21 1629  TSH 0.259*  --   FREET4  --  0.96   Anemia Panel: No results for input(s): VITAMINB12, FOLATE, FERRITIN, TIBC, IRON, RETICCTPCT in the last 72 hours. Sepsis Labs: No results for input(s): PROCALCITON, LATICACIDVEN in the last 168 hours.  Recent Results (from the past 240 hour(s))  Resp Panel by RT-PCR (Flu A&B, Covid) Nasopharyngeal Swab     Status: Abnormal   Collection  Time: 03/10/21  8:07 PM   Specimen: Nasopharyngeal Swab; Nasopharyngeal(NP) swabs in vial transport medium  Result Value Ref Range Status   SARS Coronavirus 2 by RT PCR NEGATIVE NEGATIVE Final    Comment: (NOTE) SARS-CoV-2 target nucleic acids are NOT DETECTED.  The SARS-CoV-2 RNA is generally detectable in upper respiratory specimens during the acute phase of infection. The lowest concentration of SARS-CoV-2 viral copies this assay can detect is 138 copies/mL. A negative result does not preclude SARS-Cov-2 infection and should not be used as the sole basis for treatment or other patient management decisions. A negative result may occur with  improper specimen  collection/handling, submission of specimen other than nasopharyngeal swab, presence of viral mutation(s) within the areas targeted by this assay, and inadequate number of viral copies(<138 copies/mL). A negative result must be combined with clinical observations, patient history, and epidemiological information. The expected result is Negative.  Fact Sheet for Patients:  EntrepreneurPulse.com.au  Fact Sheet for Healthcare Providers:  IncredibleEmployment.be  This test is no t yet approved or cleared by the [place] FDA and  has been authorized for detection and/or diagnosis of SARS-CoV-2 by FDA under an Emergency Use Authorization (EUA). This EUA will remain  in effect (meaning this test can be used) for the duration of the COVID-19 declaration under Section 564(b)(1) of the Act, 21 U.S.C.section 360bbb-3(b)(1), unless the authorization is terminated  or revoked sooner.       Influenza A by PCR POSITIVE (A) NEGATIVE Final   Influenza B by PCR NEGATIVE NEGATIVE Final    Comment: (NOTE) The Xpert Xpress SARS-CoV-2/FLU/RSV plus assay is intended as an aid in the diagnosis of influenza from Nasopharyngeal swab specimens and should not be used as a sole basis for treatment. Nasal  washings and aspirates are unacceptable for Xpert Xpress SARS-CoV-2/FLU/RSV testing.  Fact Sheet for Patients: EntrepreneurPulse.com.au  Fact Sheet for Healthcare Providers: IncredibleEmployment.be  This test is not yet approved or cleared by the [place] FDA and has been authorized for detection and/or diagnosis of SARS-CoV-2 by FDA under an Emergency Use Authorization (EUA). This EUA will remain in effect (meaning this test can be used) for the duration of the COVID-19 declaration under Section 564(b)(1) of the Act, 21 U.S.C. section 360bbb-3(b)(1), unless the authorization is terminated or revoked.  Performed at [facility], [place] [address] [place], [place] [address]          Radiology Studies: DG Chest 2 View  Result Date: 03/10/2021 CLINICAL DATA:  Shortness of breath EXAM: CHEST - 2 VIEW COMPARISON:  08/27/2013 FINDINGS: Borderline cardiomegaly. No focal opacity, pleural effusion, or pneumothorax. IMPRESSION: No active cardiopulmonary disease.  Borderline to mild cardiomegaly. Electronically Signed   By: Donavan Foil M.D.   On: 03/10/2021 20:44   CT Head Wo Contrast  Result Date: 03/10/2021 CLINICAL DATA:  Syncopal episode.  Diabetic patient. EXAM: CT HEAD WITHOUT CONTRAST TECHNIQUE: Contiguous axial images were obtained from the base of the skull through the vertex without intravenous contrast. COMPARISON:  MRI brain and CT scan head both 08/27/2013. 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 Again noted is an asymmetric prominent perivascular space in the posterior right basal ganglia. Atrophy and small-vessel changes are slightly progressed from 2015. There is no midline shift. No new asymmetry is seen concerning  for acute infarct, hemorrhage or mass. Vascular: There are patchy calcifications in the siphons, distal vertebral arteries, somewhat increased since 2015. There are no hyperdense central vessels. Skull: Normal. Negative for fracture or focal lesion. Sinuses/Orbits: No acute finding. Other: None. IMPRESSION: No acute intracranial CT findings. Chronic changes. Slightly progressive cerebral atrophy and small-vessel disease since 2015, and progressive vascular calcifications. Electronically Signed   By: Telford Nab M.D.   On: 03/10/2021 20:37        Scheduled Meds:  enoxaparin (LOVENOX) injection  40 mg Subcutaneous Q24H   insulin aspart  0-5 Units Subcutaneous QHS   insulin aspart  0-9 Units Subcutaneous TID WC   insulin  aspart  4 Units Subcutaneous TID WC   insulin glargine-yfgn  20 Units Subcutaneous Daily   oseltamivir  30 mg Oral BID   sodium chloride flush  3 mL Intravenous Q12H   Continuous Infusions:  sodium chloride 100 mL/hr at 03/12/21 0843     LOS: 2 days    Time spent: More than 50% of that time was spent in counseling and/or coordination of care.      Shelly Coss, MD [facility] P12/05/2020, 10:41 AM

## 2021-03-12 NOTE — Evaluation (Signed)
Physical Therapy Evaluation Patient Details Name: Kristi Gonzalez MRN: [id number] DOB: 1945/12/11 Today's Date: 03/12/2021  History of Present Illness  Pt is a 75 y.o. female admitted following  syncopal episode at home in setting of influenza A infection and dehydration, poor appetite, nonproductive cough, and chills. PMH significant for HTN, DM, anemia, and paroxysmal a-fib.  Clinical Impression  Pt is a 75 y.o. female admitted with above HPI resulting in the deficits listed below (see PT Problem List). Pt reports independence at baseline without use of AD.  Pt performed sit to stand transfers with supervision for safety. Pt ambulated total of ~26ft with MIN guard progressing to supervision for safety. Pt with x1 episode of R drifting able to self correct, reporting she felt "light",improved with further ambulation distance. Pt politely deferred stair training this session due to fatigue. PT educated pt and daughter on awareness of symptoms and ways to maximize safety and reduce risk of injury at home if experiencing symptoms, both verbalized understanding. Pt will benefit from continued skilled PT to maximize functional mobility, and increase independence to return to PLOF.          Recommendations for follow up therapy are one component of a multi-disciplinary discharge planning process, led by the attending physician.  Recommendations may be updated based on patient status, additional functional criteria and insurance authorization.    03/12/21 1030  Orthostatic Lying   BP- Lying 141/69  Pulse- Lying 97  Orthostatic Sitting  BP- Sitting 150/68  Pulse- Sitting 96  Orthostatic Standing at 0 minutes  BP- Standing at 0 minutes 128/62 (denied symptoms in static standing)  Pulse- Standing at 0 minutes 105   BP following ambulation 132/77mmHg. HR observed up to 122bpm during ambulation.    Follow Up Recommendations Home health PT    Assistance Recommended at Discharge Intermittent  Supervision/Assistance  Functional Status Assessment Patient has had a recent decline in their functional status and demonstrates the ability to make significant improvements in function in a reasonable and predictable amount of time.  Equipment Recommendations  None recommended by PT    Recommendations for Other Services       Precautions / Restrictions Precautions Precautions: Fall Precaution Comments: monitor BP Restrictions Weight Bearing Restrictions: No      Mobility  Bed Mobility Overal bed mobility: Modified Independent             General bed mobility comments: HOB elevated    Transfers Overall transfer level: Needs assistance Equipment used: None Transfers: Sit to/from Stand Sit to Stand: Supervision           General transfer comment: supervision for safety,pt with mininal use of hands for power up from EOB and use of grab bar to rise from toilet    Ambulation/Gait Ambulation/Gait assistance: Min guard;Supervision Gait Distance (Feet): 200 Feet Assistive device: None Gait Pattern/deviations: Step-through pattern;Decreased stride length Gait velocity: decr     General Gait Details: Pt with drift to R x1, able to self correct with verbal cuing to maintain to middle of hallway. reporting she "felt a little light" improved with further ambulation distance and demonstrating improved stability progressing from MIN guard to supervision. Close chair follow provided for safety due to history of syncopal episode at home. educated pt and daughter on awareness of symptoms and ways to maximize safety at home if experiencing symptoms, both verbalized understanding. Pt politely deferred stair training this session due to fatigue.  Stairs  Wheelchair Mobility    Modified Rankin (Stroke Patients Only)       Balance Overall balance assessment: Mild deficits observed, not formally tested                                            Pertinent Vitals/Pain Pain Assessment: No/denies pain    Home Living Family/patient expects to be discharged to:: Private residence Living Arrangements: Alone Available Help at Discharge: Other (Comment) (limited, daughters live as far as [place] per pt report) Type of Home: House Home Access: Stairs to enter Entrance Stairs-Rails: Right Entrance Stairs-Number of Steps: 4 Alternate Level Stairs-Number of Steps: 12 Home Layout: Laundry or work area in basement;Two level;Able to live on main level with bedroom/bathroom Home Equipment: Standard Walker;Shower seat (portable armrest/grabbar to place over toilet) Additional Comments: has daughters but they do not live close toprovide supervision. Still works part time at a deli. No other family/friends able to provide per pt report. Denies history of falls prior to syncopal episode for which she is currently admitted.    Prior Function Prior Level of Function : Independent/Modified Independent;Driving                     Hand Dominance   Dominant Hand: Right    Extremity/Trunk Assessment   Upper Extremity Assessment Upper Extremity Assessment: Overall WFL for tasks assessed    Lower Extremity Assessment Lower Extremity Assessment: Overall WFL for tasks assessed    Cervical / Trunk Assessment Cervical / Trunk Assessment: Normal  Communication   Communication: No difficulties  Cognition Arousal/Alertness: Awake/alert Behavior During Therapy: WFL for tasks assessed/performed Overall Cognitive Status: Within Functional Limits for tasks assessed               Assessment/Plan    PT Assessment Patient needs continued PT services  PT Problem List Decreased activity tolerance;Decreased balance;Decreased mobility       PT Treatment Interventions Gait training;Stair training;Functional mobility training;Therapeutic activities;Therapeutic exercise;Balance training;Patient/family education    PT Goals (Current  goals can be found in the Care Plan section)  Acute Rehab PT Goals Patient Stated Goal: Go home soon PT Goal Formulation: With patient/family Time For Goal Achievement: 03/26/21 Potential to Achieve Goals: Good    Frequency Min 3X/week   Barriers to discharge Decreased caregiver support      AM-PAC PT "6 Clicks" Mobility  Outcome Measure Help needed turning from your back to your side while in a flat bed without using bedrails?: None Help needed moving from lying on your back to sitting on the side of a flat bed without using bedrails?: None Help needed moving to and from a bed to a chair (including a wheelchair)?: A Little Help needed standing up from a chair using your arms (e.g., wheelchair or bedside chair)?: A Little Help needed to walk in hospital room?: A Little Help needed climbing 3-5 steps with a railing? : A Little 6 Click Score: 20    End of Session Equipment Utilized During Treatment: Gait belt Activity Tolerance: Patient tolerated treatment well Patient left: in chair;with call bell/phone within reach;with family/visitor present Nurse Communication: Mobility status PT Visit Diagnosis: Unsteadiness on feet (R26.81);Other abnormalities of gait and mobility (R26.89)    Time: 0865-7846 PT Time Calculation (min) (ACUTE ONLY): 31 min   Charges:   PT Evaluation $PT Eval Low Complexity: 1 Low PT Treatments $Therapeutic  Activity: 8-22 mins        Festus Barren PT, DPT  Acute Rehabilitation Services  Office [phone]  03/12/2021, 2:29 PM

## 2021-03-12 NOTE — Plan of Care (Signed)
  Problem: Activity: Goal: Risk for activity intolerance will decrease Outcome: Progressing   Problem: Coping: Goal: Level of anxiety will decrease Outcome: Progressing   Problem: Elimination: Goal: Will not experience complications related to bowel motility Outcome: Progressing Goal: Will not experience complications related to urinary retention Outcome: Progressing   

## 2021-03-12 NOTE — Progress Notes (Signed)
  Echocardiogram 2D Echocardiogram has been performed.  Kristi Gonzalez 03/12/2021, 3:40 PM

## 2021-03-12 NOTE — Plan of Care (Signed)
Kristi Gonzalez reports feeling a bit better today. She is maintaining her O2 sat >92% on RA. She ambulated in the hall with PT this morning and sat up in the chair the majority of the day thus far. No c/o dizziness or lightheadedness.  Problem: Education: Goal: Knowledge of General Education information will improve Description: Including pain rating scale, medication(s)/side effects and non-pharmacologic comfort measures Outcome: Progressing   Problem: Health Behavior/Discharge Planning: Goal: Ability to manage health-related needs will improve Outcome: Progressing   Problem: Clinical Measurements: Goal: Ability to maintain clinical measurements within normal limits will improve Outcome: Progressing Goal: Will remain free from infection Outcome: Progressing Note: Flu +, on tamiflu Goal: Diagnostic test results will improve Outcome: Progressing Goal: Respiratory complications will improve Outcome: Progressing Note: Ambulated in hall on RA Goal: Cardiovascular complication will be avoided Outcome: Progressing   Problem: Activity: Goal: Risk for activity intolerance will decrease Outcome: Progressing   Problem: Nutrition: Goal: Adequate nutrition will be maintained Outcome: Progressing   Problem: Elimination: Goal: Will not experience complications related to bowel motility Outcome: Progressing   Problem: Pain Managment: Goal: General experience of comfort will improve Outcome: Progressing   Problem: Safety: Goal: Ability to remain free from injury will improve Outcome: Progressing

## 2021-03-13 ENCOUNTER — Other Ambulatory Visit: Payer: Self-pay | Admitting: Internal Medicine

## 2021-03-13 DIAGNOSIS — R55 Syncope and collapse: Secondary | ICD-10-CM | POA: Diagnosis not present

## 2021-03-13 LAB — CBC
HCT: 31.3 % — ABNORMAL LOW (ref 36.0–46.0)
Hemoglobin: 9.6 g/dL — ABNORMAL LOW (ref 12.0–15.0)
MCH: 26.2 pg (ref 26.0–34.0)
MCHC: 30.7 g/dL (ref 30.0–36.0)
MCV: 85.3 fL (ref 80.0–100.0)
Platelets: 193 10*3/uL (ref 150–400)
RBC: 3.67 MIL/uL — ABNORMAL LOW (ref 3.87–5.11)
RDW: 13.3 % (ref 11.5–15.5)
WBC: 3.4 10*3/uL — ABNORMAL LOW (ref 4.0–10.5)
nRBC: 0 % (ref 0.0–0.2)

## 2021-03-13 LAB — GLUCOSE, CAPILLARY
Glucose-Capillary: 114 mg/dL — ABNORMAL HIGH (ref 70–99)
Glucose-Capillary: 124 mg/dL — ABNORMAL HIGH (ref 70–99)
Glucose-Capillary: 171 mg/dL — ABNORMAL HIGH (ref 70–99)

## 2021-03-13 LAB — BASIC METABOLIC PANEL
Anion gap: 7 (ref 5–15)
BUN: 11 mg/dL (ref 8–23)
CO2: 24 mmol/L (ref 22–32)
Calcium: 8.7 mg/dL — ABNORMAL LOW (ref 8.9–10.3)
Chloride: 103 mmol/L (ref 98–111)
Creatinine, Ser: 0.79 mg/dL (ref 0.44–1.00)
GFR, Estimated: >60 mL/min (ref >60)
Glucose, Bld: 121 mg/dL — ABNORMAL HIGH (ref 70–99)
Potassium: 3.9 mmol/L (ref 3.5–5.1)
Sodium: 134 mmol/L — ABNORMAL LOW (ref 135–145)

## 2021-03-13 LAB — T3, FREE: T3, Free: 1.8 pg/mL — ABNORMAL LOW (ref 2.0–4.4)

## 2021-03-13 MED ORDER — GUAIFENESIN-DM 100-10 MG/5ML PO SYRP: 5.0000 mL | ORAL_SOLUTION | ORAL | Status: DC | PRN

## 2021-03-13 MED ORDER — OSELTAMIVIR PHOSPHATE 30 MG PO CAPS: 30.0000 mg | ORAL_CAPSULE | Freq: Two times a day (BID) | ORAL | 0 refills | Status: DC

## 2021-03-13 MED ORDER — OSELTAMIVIR PHOSPHATE 75 MG PO CAPS: 75.0000 mg | ORAL_CAPSULE | Freq: Every day | ORAL | 0 refills | Status: AC

## 2021-03-13 MED ORDER — BENAZEPRIL HCL 20 MG PO TABS: 20.0000 mg | ORAL_TABLET | Freq: Every day | ORAL | 11 refills | Status: AC

## 2021-03-13 NOTE — Plan of Care (Signed)
  Problem: Clinical Measurements: Goal: Diagnostic test results will improve Outcome: Progressing   Problem: Clinical Measurements: Goal: Diagnostic test results will improve Outcome: Progressing

## 2021-03-13 NOTE — Discharge Summary (Signed)
Physician Discharge Summary  Kristi Gonzalez ZHY:[id number] DOB: January 03, 1946 DOA: 03/10/2021  PCP: Reynold Bowen, MD  Admit date: 03/10/2021 Discharge date: 03/13/2021  Admitted From: Home Disposition:  Home  Discharge Condition:Stable CODE STATUS:FULL Diet recommendation: Carb Modified   Brief/Interim Summary: Patient is a 75 year old female with history of hypertension, diabetes type 2 who presented to the emergency department with complaint of syncopal episode at home.  She also had poor appetite, nonproductive cough, chills for couple of days, was feeling lightheaded.  She had transient loss of consciousness.  On presentation she was mildly febrile, saturating fine on room air, mild aortic.  Chest x-ray did not show pneumonia.  CT head negative for any acute intracranial abnormality.  Lab work showed sodium of 123, creatinine of 1.12.  Flu was positive. Started on Tamiflu.  Patient admitted for the management of syncope. Her overall status gradually improved.  Respiratory status stable, she is on room air.  She feels much better today.  PT recommended home health upon discharge. She is medically stable for discharge to home today.  Following problems were addressed during her hospitalization:  Syncopal episode: In the setting of dehydration from poor oral intake from influenza A.  Orthostatic vital signs negative.  PT consulted, recommended home health.  Echo did not show any valvular abnormalities, EF of 60 to 29%, grade 1 diastolic dysfunction   Hyponatremia:Presented with sodium of 123.  Most likely this is from hypovolemic hyponatremia from dehydration.  She was also on hydrochlorothiazide at home.  Hydrochlorothiazide discontinued on discharge.  Sodium is stable at 131 today.   Influenza A: Started on Tamiflu. .  Currently on room air.  Chest x-ray did not show pneumonia   Mild AKI: Most likely secondary to dehydration.  Resolved   Insulin-dependent diabetes type 2: Continue  sliding-scale insulin.  Monitor blood sugars.  Takes glipizide, insulin at home.  Hemoglobin A1c of 8.5   Normocytic anemia: Hemoglobin stable in the range of 9-10.  No evidence of bleeding.   Abnormal thyroid function test: Low TSH.  Normal T4.  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  Check TFT in 4 to 6 weeks as an outpatient   Hypertension: Takes metoprolol at home, benazepril-hydrochlorothiazide mildly hypertensive today.  We will continue benazepril only  discharge.  Currently blood pressure stable   Hyperlipidemia: Takes simvastatin       Discharge Diagnoses:  Principal Problem:   Syncope Active Problems:   Insulin-requiring or dependent type II diabetes mellitus (HCC)   ANEMIA, IRON DEFICIENCY, CHRONIC   Influenza A   HTN (hypertension)   Hyponatremia   Mild renal insufficiency    Discharge Instructions  Discharge Instructions     Diet Carb Modified   Complete by: As directed    Discharge instructions   Complete by: As directed    1)Please take prescribed medications as instructed 2)Follow up with your PCP in a week 3) we have discontinued hydrochlorothiazide and  metoprolol .Continue benazepril only at this point.  If your  blood pressure is normal or low, do not take blood pressure medication.  Monitor blood pressure at home   Increase activity slowly   Complete by: As directed       Allergies as of 03/13/2021       Reactions   Canagliflozin Other (See Comments)   dizziness   Codeine Itching        Medication List     STOP taking these medications    benazepril-hydrochlorthiazide 20-12.5 MG tablet Commonly known as:  LOTENSIN HCT   HumaLOG Mix 75/25 KwikPen (75-25) 100 UNIT/ML Kwikpen Generic drug: Insulin Lispro Prot & Lispro   metoprolol succinate 50 MG 24 hr tablet Commonly known as: TOPROL-XL       TAKE these medications    acetaminophen 325 MG tablet Commonly known as: Tylenol Take 2 tablets (650 mg total) by mouth every 6 (six) hours as  needed.   aspirin 81 MG tablet Take 81 mg by mouth daily.   benazepril 20 MG tablet Commonly known as: LOTENSIN Take 1 tablet (20 mg total) by mouth daily.   folic acid 093 MCG tablet Commonly known as: FOLVITE Take 400 mcg by mouth daily.   glipiZIDE 10 MG 24 hr tablet Commonly known as: GLUCOTROL XL Take 10 mg by mouth daily.   HumuLIN N KwikPen 100 UNIT/ML Kiwkpen Generic drug: Insulin NPH (Human) (Isophane) Inject 28 Units into the skin daily.   insulin lispro 100 UNIT/ML KwikPen Commonly known as: HUMALOG Inject 10-16 Units into the skin See admin instructions. Taking 10 units with Breakfast and 16 units with lunch and 10 units at dinner.   oseltamivir 30 MG capsule Commonly known as: TAMIFLU Take 1 capsule (30 mg total) by mouth 2 (two) times daily for 3 days.   simvastatin 40 MG tablet Commonly known as: ZOCOR Take 40 mg by mouth every evening.        Allergies  Allergen Reactions   Canagliflozin Other (See Comments)    dizziness   Codeine Itching    Consultations: None   Procedures/Studies: DG Chest 2 View  Result Date: 03/10/2021 CLINICAL DATA:  Shortness of breath EXAM: CHEST - 2 VIEW COMPARISON:  08/27/2013 FINDINGS: Borderline cardiomegaly. No focal opacity, pleural effusion, or pneumothorax. IMPRESSION: No active cardiopulmonary disease.  Borderline to mild cardiomegaly. Electronically Signed   By: Donavan Foil M.D.   On: 03/10/2021 20:44   CT Head Wo Contrast  Result Date: 03/10/2021 CLINICAL DATA:  Syncopal episode.  Diabetic patient. EXAM: CT HEAD WITHOUT CONTRAST TECHNIQUE: Contiguous axial images were obtained from the base of the skull through the vertex without intravenous contrast. COMPARISON:  MRI brain and CT scan head both 08/27/2013. 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 Again noted is an asymmetric prominent perivascular space in the posterior right basal ganglia. Atrophy and small-vessel changes are slightly progressed from 2015. There is no midline shift. No new asymmetry is seen concerning for acute infarct, hemorrhage or mass. Vascular: There are patchy calcifications in the siphons, distal vertebral arteries, somewhat increased since 2015. There are no hyperdense central vessels. Skull: Normal. Negative for fracture or focal lesion. Sinuses/Orbits: No acute finding. Other: None. IMPRESSION: No acute intracranial CT findings. Chronic changes. Slightly progressive cerebral atrophy and small-vessel disease since 2015, and progressive vascular calcifications. Electronically Signed   By: Telford Nab M.D.   On: 03/10/2021 20:37   ECHOCARDIOGRAM COMPLETE  Result Date: 03/12/2021    ECHOCARDIOGRAM REPORT   Patient Name:   KINZLY PIERRELOUIS Mesa Date of Exam: 03/12/2021 Medical Rec #:  [id number]       Height:       63.0 in Accession #:    [id number]      Weight:       172.8 lb Date of Birth:  1946/02/17       BSA:  1.817 m Patient Age:    18 years        BP:           110/63 mmHg Patient Gender: F               HR:           94 bpm. Exam Location:  Inpatient Procedure: 2D Echo, 3D Echo, Cardiac Doppler and Color Doppler Indications:    R55 Syncope  History:        Patient has no prior history of Echocardiogram examinations.                 Signs/Symptoms:Syncope; Risk Factors:Diabetes and Hypertension.                 FLU positive.  Sonographer:    Roseanna Rainbow RDCS Referring Phys: [id number] TIMOTHY S OPYD  Sonographer Comments: Technically difficult study due to poor echo windows and patient is morbidly obese. Image acquisition challenging due to patient body habitus. IMPRESSIONS  1. Left ventricular ejection fraction, by estimation, is 60 to 65%. The left ventricle has normal function. The left ventricle has no regional wall motion  abnormalities. There is moderate asymmetric left ventricular hypertrophy of the basal-septal segment. Left ventricular diastolic parameters are consistent with Grade I diastolic dysfunction (impaired relaxation).  2. Right ventricular systolic function is normal. The right ventricular size is normal. There is normal pulmonary artery systolic pressure. The estimated right ventricular systolic pressure is 95.6 mmHg.  3. The mitral valve is normal in structure. No evidence of mitral valve regurgitation.  4. The aortic valve was not well visualized. Aortic valve regurgitation is not visualized. No aortic stenosis is present.  5. The inferior vena cava is normal in size with greater than 50% respiratory variability, suggesting right atrial pressure of 3 mmHg. FINDINGS  Left Ventricle: Left ventricular ejection fraction, by estimation, is 60 to 65%. The left ventricle has normal function. The left ventricle has no regional wall motion abnormalities. The left ventricular internal cavity size was normal in size. There is  moderate asymmetric left ventricular hypertrophy of the basal-septal segment. Left ventricular diastolic parameters are consistent with Grade I diastolic dysfunction (impaired relaxation). Right Ventricle: The right ventricular size is normal. Right vetricular wall thickness was not well visualized. Right ventricular systolic function is normal. There is normal pulmonary artery systolic pressure. The tricuspid regurgitant velocity is 2.33 m/s, and with an assumed right atrial pressure of 3 mmHg, the estimated right ventricular systolic pressure is 38.7 mmHg. Left Atrium: Left atrial size was normal in size. Right Atrium: Right atrial size was normal in size. Pericardium: There is no evidence of pericardial effusion. Mitral Valve: The mitral valve is normal in structure. No evidence of mitral valve regurgitation. Tricuspid Valve: The tricuspid valve is normal in structure. Tricuspid valve regurgitation is  trivial. Aortic Valve: The aortic valve was not well visualized. Aortic valve regurgitation is not visualized. No aortic stenosis is present. Pulmonic Valve: The pulmonic valve was not well visualized. Pulmonic valve regurgitation is not visualized. Aorta: The aortic root and ascending aorta are structurally normal, with no evidence of dilitation. Venous: The inferior vena cava is normal in size with greater than 50% respiratory variability, suggesting right atrial pressure of 3 mmHg. IAS/Shunts: The interatrial septum was not well visualized.  LEFT VENTRICLE PLAX 2D LVIDd:         4.00 cm     Diastology LVIDs:         2.80  cm     LV e' medial:    4.79 cm/s LV PW:         1.40 cm     LV E/e' medial:  10.6 LV IVS:        1.30 cm     LV e' lateral:   5.55 cm/s LVOT diam:     2.20 cm     LV E/e' lateral: 9.2 LV SV:         62 LV SV Index:   34 LVOT Area:     3.80 cm                             3D Volume EF: LV Volumes (MOD)           3D EF:        58 % LV vol d, MOD A2C: 69.3 ml LV EDV:       90 ml LV vol d, MOD A4C: 64.2 ml LV ESV:       38 ml LV vol s, MOD A2C: 27.3 ml LV SV:        52 ml LV vol s, MOD A4C: 26.3 ml LV SV MOD A2C:     42.0 ml LV SV MOD A4C:     64.2 ml LV SV MOD BP:      42.1 ml RIGHT VENTRICLE             IVC RV S prime:     12.50 cm/s  IVC diam: 1.10 cm TAPSE (M-mode): 1.8 cm LEFT ATRIUM             Index        RIGHT ATRIUM           Index LA diam:        2.90 cm 1.60 cm/m   RA Area:     11.90 cm LA Vol (A2C):   42.7 ml 23.50 ml/m  RA Volume:   26.10 ml  14.36 ml/m LA Vol (A4C):   26.7 ml 14.69 ml/m LA Biplane Vol: 33.6 ml 18.49 ml/m  AORTIC VALVE LVOT Vmax:   93.40 cm/s LVOT Vmean:  64.700 cm/s LVOT VTI:    0.163 m  AORTA Ao Root diam: 3.60 cm Ao Asc diam:  3.30 cm MITRAL VALVE               TRICUSPID VALVE MV Area (PHT): 4.89 cm    TR Peak grad:   21.7 mmHg MV Decel Time: 155 msec    TR Vmax:        233.00 cm/s MV E velocity: 51.00 cm/s MV A velocity: 98.50 cm/s  SHUNTS MV E/A ratio:   0.52        Systemic VTI:  0.16 m                            Systemic Diam: 2.20 cm Oswaldo Milian MD Electronically signed by Oswaldo Milian MD Signature Date/Time: 03/12/2021/5:33:05 PM    Final       Subjective: Patient seen and examined at the bedside this morning.  Hemodynamically stable for discharge today  Discharge Exam: Vitals:   03/12/21 2207 03/13/21 0536  BP: (!) 153/71 (!) 159/78  Pulse: 89 92  Resp: 20 18  Temp:  98.6 F (37 C)  SpO2:  96%   Vitals:   03/12/21 2013 03/12/21 2207 03/13/21 0500  03/13/21 0536  BP: (!) 162/76 (!) 153/71  (!) 159/78  Pulse: 100 89  92  Resp: 20 20  18   Temp: 98.2 F (36.8 C)   98.6 F (37 C)  TempSrc: Oral   Oral  SpO2: 98%   96%  Weight:   77.1 kg   Height:        General: Pt is alert, awake, not in acute distress Cardiovascular: RRR, S1/S2 +, no rubs, no gallops Respiratory: CTA bilaterally, no wheezing, no rhonchi Abdominal: Soft, NT, ND, bowel sounds + Extremities: no edema, no cyanosis    The results of significant diagnostics from this hospitalization (including imaging, microbiology, ancillary and laboratory) are listed below for reference.     Microbiology: Recent Results (from the past 240 hour(s))  Resp Panel by RT-PCR (Flu A&B, Covid) Nasopharyngeal Swab     Status: Abnormal   Collection Time: 03/10/21  8:07 PM   Specimen: Nasopharyngeal Swab; Nasopharyngeal(NP) swabs in vial transport medium  Result Value Ref Range Status   SARS Coronavirus 2 by RT PCR NEGATIVE NEGATIVE Final    Comment: (NOTE) SARS-CoV-2 target nucleic acids are NOT DETECTED.  The SARS-CoV-2 RNA is generally detectable in upper respiratory specimens during the acute phase of infection. The lowest concentration of SARS-CoV-2 viral copies this assay can detect is 138 copies/mL. A negative result does not preclude SARS-Cov-2 infection and should not be used as the sole basis for treatment or other patient management decisions. A  negative result may occur with  improper specimen collection/handling, submission of specimen other than nasopharyngeal swab, presence of viral mutation(s) within the areas targeted by this assay, and inadequate number of viral copies(<138 copies/mL). A negative result must be combined with clinical observations, patient history, and epidemiological information. The expected result is Negative.  Fact Sheet for Patients:  EntrepreneurPulse.com.au  Fact Sheet for Healthcare Providers:  IncredibleEmployment.be  This test is no t yet approved or cleared by the [place] FDA and  has been authorized for detection and/or diagnosis of SARS-CoV-2 by FDA under an Emergency Use Authorization (EUA). This EUA will remain  in effect (meaning this test can be used) for the duration of the COVID-19 declaration under Section 564(b)(1) of the Act, 21 U.S.C.section 360bbb-3(b)(1), unless the authorization is terminated  or revoked sooner.       Influenza A by PCR POSITIVE (A) NEGATIVE Final   Influenza B by PCR NEGATIVE NEGATIVE Final    Comment: (NOTE) The Xpert Xpress SARS-CoV-2/FLU/RSV plus assay is intended as an aid in the diagnosis of influenza from Nasopharyngeal swab specimens and should not be used as a sole basis for treatment. Nasal washings and aspirates are unacceptable for Xpert Xpress SARS-CoV-2/FLU/RSV testing.  Fact Sheet for Patients: EntrepreneurPulse.com.au  Fact Sheet for Healthcare Providers: IncredibleEmployment.be  This test is not yet approved or cleared by the [place] FDA and has been authorized for detection and/or diagnosis of SARS-CoV-2 by FDA under an Emergency Use Authorization (EUA). This EUA will remain in effect (meaning this test can be used) for the duration of the COVID-19 declaration under Section 564(b)(1) of the Act, 21 U.S.C. section 360bbb-3(b)(1), unless the  authorization is terminated or revoked.  Performed at [facility], [place] [address] [place], [place] [address]      Labs: BNP (last 3 results) No results for input(s): BNP in the last 8760 hours. Basic Metabolic Panel: Recent Labs  Lab 03/10/21 2005 03/11/21 0306 03/11/21 1630 03/12/21 0512 03/13/21 0546  NA 123*  124* 129* 127* 134*  K 4.0 3.5 3.6 3.8 3.9  CL 88* 93* 95* 100 103  CO2 24 23 24 23 24   GLUCOSE 221* 168* 159* 132* 121*  BUN 16 18 20 18 11   CREATININE 1.12* 0.98 1.10* 1.00 0.79  CALCIUM 8.8* 8.3* 8.6* 8.3* 8.7*  MG 1.5*  --   --   --   --    Liver Function Tests: Recent Labs  Lab 03/10/21 2005  AST 29  ALT 14  ALKPHOS 60  BILITOT 0.6  PROT 7.8  ALBUMIN 3.7   Recent Labs  Lab 03/10/21 2005  LIPASE 42   No results for input(s): AMMONIA in the last 168 hours. CBC: Recent Labs  Lab 03/10/21 2005 03/11/21 0306 03/12/21 0512 03/13/21 0546  WBC 7.1 5.8 3.7* 3.4*  NEUTROABS 5.7  --   --   --   HGB 10.4* 9.7* 9.5* 9.6*  HCT 32.9* 30.4* 30.5* 31.3*  MCV 83.5 83.7 84.7 85.3  PLT 206 189 169 193   Cardiac Enzymes: Recent Labs  Lab 03/10/21 2005  CKTOTAL 229   BNP: Invalid input(s): POCBNP CBG: Recent Labs  Lab 03/12/21 1127 03/12/21 1635 03/12/21 2200 03/13/21 0456 03/13/21 0733  GLUCAP 222* 138* 122* 114* 124*   D-Dimer No results for input(s): DDIMER in the last 72 hours. Hgb A1c Recent Labs    03/11/21 0306  HGBA1C 8.5*   Lipid Profile No results for input(s): CHOL, HDL, LDLCALC, TRIG, CHOLHDL, LDLDIRECT in the last 72 hours. Thyroid function studies Recent Labs    03/10/21 2313 03/11/21 1629  TSH 0.259*  --   T3FREE  --  1.8*   Anemia work up No results for input(s): VITAMINB12, FOLATE, FERRITIN, TIBC, IRON, RETICCTPCT in the last 72 hours. Urinalysis    Component Value Date/Time   COLORURINE YELLOW 03/11/2021 0000   APPEARANCEUR HAZY (A) 03/11/2021 0000   LABSPEC 1.018 03/11/2021 0000    PHURINE 5.0 03/11/2021 0000   GLUCOSEU 50 (A) 03/11/2021 0000   HGBUR NEGATIVE 03/11/2021 0000   BILIRUBINUR NEGATIVE 03/11/2021 0000   KETONESUR 5 (A) 03/11/2021 0000   PROTEINUR 30 (A) 03/11/2021 0000   UROBILINOGEN 1.0 06/12/2014 1709   NITRITE NEGATIVE 03/11/2021 0000   LEUKOCYTESUR NEGATIVE 03/11/2021 0000   Sepsis Labs Invalid input(s): PROCALCITONIN,  WBC,  LACTICIDVEN Microbiology Recent Results (from the past 240 hour(s))  Resp Panel by RT-PCR (Flu A&B, Covid) Nasopharyngeal Swab     Status: Abnormal   Collection Time: 03/10/21  8:07 PM   Specimen: Nasopharyngeal Swab; Nasopharyngeal(NP) swabs in vial transport medium  Result Value Ref Range Status   SARS Coronavirus 2 by RT PCR NEGATIVE NEGATIVE Final    Comment: (NOTE) SARS-CoV-2 target nucleic acids are NOT DETECTED.  The SARS-CoV-2 RNA is generally detectable in upper respiratory specimens during the acute phase of infection. The lowest concentration of SARS-CoV-2 viral copies this assay can detect is 138 copies/mL. A negative result does not preclude SARS-Cov-2 infection and should not be used as the sole basis for treatment or other patient management decisions. A negative result may occur with  improper specimen collection/handling, submission of specimen other than nasopharyngeal swab, presence of viral mutation(s) within the areas targeted by this assay, and inadequate number of viral copies(<138 copies/mL). A negative result must be combined with clinical observations, patient history, and epidemiological information. The expected result is Negative.  Fact Sheet for Patients:  EntrepreneurPulse.com.au  Fact Sheet for Healthcare Providers:  IncredibleEmployment.be  This test is no  t yet approved or cleared by the [place] and  has been authorized for detection and/or diagnosis of SARS-CoV-2 by FDA under an Emergency Use Authorization (EUA). This EUA will remain   in effect (meaning this test can be used) for the duration of the COVID-19 declaration under Section 564(b)(1) of the Act, 21 U.S.C.section 360bbb-3(b)(1), unless the authorization is terminated  or revoked sooner.       Influenza A by PCR POSITIVE (A) NEGATIVE Final   Influenza B by PCR NEGATIVE NEGATIVE Final    Comment: (NOTE) The Xpert Xpress SARS-CoV-2/FLU/RSV plus assay is intended as an aid in the diagnosis of influenza from Nasopharyngeal swab specimens and should not be used as a sole basis for treatment. Nasal washings and aspirates are unacceptable for Xpert Xpress SARS-CoV-2/FLU/RSV testing.  Fact Sheet for Patients: EntrepreneurPulse.com.au  Fact Sheet for Healthcare Providers: IncredibleEmployment.be  This test is not yet approved or cleared by the [place] FDA and has been authorized for detection and/or diagnosis of SARS-CoV-2 by FDA under an Emergency Use Authorization (EUA). This EUA will remain in effect (meaning this test can be used) for the duration of the COVID-19 declaration under Section 564(b)(1) of the Act, 21 U.S.C. section 360bbb-3(b)(1), unless the authorization is terminated or revoked.  Performed at [facility], [place] [address], [place], [place] [address]     Please note: You were cared for by a hospitalist during your hospital stay. Once you are discharged, your primary care physician will handle any further medical issues. Please note that NO REFILLS for any discharge medications will be authorized once you are discharged, as it is imperative that you return to your primary care physician (or establish a relationship with a primary care physician if you do not have one) for your post hospital discharge needs so that they can reassess your need for medications and monitor your lab values.    Time coordinating discharge: 40 minutes  SIGNED:   Shelly Coss, MD  [facility] 03/13/2021, 10:32 AM Pager [phone]  If 7PM-7AM, please contact night-coverage [email or web address] Password [email or web address]

## 2021-03-13 NOTE — Plan of Care (Signed)
  Problem: Education: Goal: Knowledge of General Education information will improve Description: Including pain rating scale, medication(s)/side effects and non-pharmacologic comfort measures Outcome: Progressing   Problem: Pain Managment: Goal: General experience of comfort will improve Outcome: Progressing   Problem: Safety: Goal: Ability to remain free from injury will improve Outcome: Progressing   

## 2021-03-13 NOTE — Progress Notes (Signed)
D/C instructions reviewed w/ pt and dtr. Both verbalize understanding and all questions answered. Pt dtr in possession of d/c packet and all personal belongings. Awaiting family member to bring pt clothes and for ride home.

## 2022-05-11 ENCOUNTER — Other Ambulatory Visit: Payer: Self-pay

## 2022-05-11 ENCOUNTER — Emergency Department (HOSPITAL_COMMUNITY)
Admission: EM | Admit: 2022-05-11 | Discharge: 2022-05-12 | Disposition: A | Discharge status: Home / Self Care | Payer: Medicare Other | Attending: Emergency Medicine | Admitting: Emergency Medicine

## 2022-05-11 DIAGNOSIS — Z794 Long term (current) use of insulin: Secondary | ICD-10-CM | POA: Insufficient documentation

## 2022-05-11 DIAGNOSIS — Z7982 Long term (current) use of aspirin: Secondary | ICD-10-CM | POA: Insufficient documentation

## 2022-05-11 DIAGNOSIS — U071 COVID-19: Secondary | ICD-10-CM

## 2022-05-11 DIAGNOSIS — R519 Headache, unspecified: Secondary | ICD-10-CM | POA: Diagnosis present

## 2022-05-12 ENCOUNTER — Emergency Department (HOSPITAL_COMMUNITY): Payer: Medicare Other

## 2022-05-12 DIAGNOSIS — U071 COVID-19: Secondary | ICD-10-CM | POA: Diagnosis not present

## 2022-05-12 LAB — COMPREHENSIVE METABOLIC PANEL
ALT: 12 U/L (ref 0–44)
AST: 19 U/L (ref 15–41)
Albumin: 3.6 g/dL (ref 3.5–5.0)
Alkaline Phosphatase: 67 U/L (ref 38–126)
Anion gap: 11 (ref 5–15)
BUN: 12 mg/dL (ref 8–23)
CO2: 24 mmol/L (ref 22–32)
Calcium: 9.4 mg/dL (ref 8.9–10.3)
Chloride: 97 mmol/L — ABNORMAL LOW (ref 98–111)
Creatinine, Ser: 1.09 mg/dL — ABNORMAL HIGH (ref 0.44–1.00)
GFR, Estimated: 53 mL/min — ABNORMAL LOW (ref >60)
Glucose, Bld: 143 mg/dL — ABNORMAL HIGH (ref 70–99)
Potassium: 3.7 mmol/L (ref 3.5–5.1)
Sodium: 132 mmol/L — ABNORMAL LOW (ref 135–145)
Total Bilirubin: 0.4 mg/dL (ref 0.3–1.2)
Total Protein: 7.7 g/dL (ref 6.5–8.1)

## 2022-05-12 LAB — URINALYSIS, ROUTINE W REFLEX MICROSCOPIC
Bacteria, UA: NONE SEEN
Bilirubin Urine: NEGATIVE
Glucose, UA: NEGATIVE mg/dL
Hgb urine dipstick: NEGATIVE
Ketones, ur: 20 mg/dL — AB
Leukocytes,Ua: NEGATIVE
Nitrite: NEGATIVE
Protein, ur: 30 mg/dL — AB
Specific Gravity, Urine: 1.015 (ref 1.005–1.030)
pH: 6 (ref 5.0–8.0)

## 2022-05-12 LAB — RESP PANEL BY RT-PCR (RSV, FLU A&B, COVID)  RVPGX2
Influenza A by PCR: NEGATIVE
Influenza B by PCR: NEGATIVE
Resp Syncytial Virus by PCR: NEGATIVE
SARS Coronavirus 2 by RT PCR: POSITIVE — AB

## 2022-05-12 LAB — CK: Total CK: 119 U/L (ref 38–234)

## 2022-05-12 LAB — CBC WITH DIFFERENTIAL/PLATELET
Abs Immature Granulocytes: 0.03 10*3/uL (ref 0.00–0.07)
Basophils Absolute: 0 10*3/uL (ref 0.0–0.1)
Basophils Relative: 0 %
Eosinophils Absolute: 0 10*3/uL (ref 0.0–0.5)
Eosinophils Relative: 0 %
HCT: 34.9 % — ABNORMAL LOW (ref 36.0–46.0)
Hemoglobin: 11.1 g/dL — ABNORMAL LOW (ref 12.0–15.0)
Immature Granulocytes: 0 %
Lymphocytes Relative: 14 %
Lymphs Abs: 1 10*3/uL (ref 0.7–4.0)
MCH: 26.8 pg (ref 26.0–34.0)
MCHC: 31.8 g/dL (ref 30.0–36.0)
MCV: 84.3 fL (ref 80.0–100.0)
Monocytes Absolute: 0.7 10*3/uL (ref 0.1–1.0)
Monocytes Relative: 10 %
Neutro Abs: 5.6 10*3/uL (ref 1.7–7.7)
Neutrophils Relative %: 76 %
Platelets: 227 10*3/uL (ref 150–400)
RBC: 4.14 MIL/uL (ref 3.87–5.11)
RDW: 13.6 % (ref 11.5–15.5)
WBC: 7.3 10*3/uL (ref 4.0–10.5)
nRBC: 0 % (ref 0.0–0.2)

## 2022-05-12 LAB — TROPONIN I (HIGH SENSITIVITY)
Troponin I (High Sensitivity): 12 ng/L (ref <18)
Troponin I (High Sensitivity): 16 ng/L (ref <18)

## 2022-05-12 LAB — LACTIC ACID, PLASMA: Lactic Acid, Venous: 1 mmol/L (ref 0.5–1.9)

## 2022-05-12 MED ORDER — LACTATED RINGERS IV BOLUS
1000.0000 mL | Freq: Once | INTRAVENOUS | Status: AC
  Administered 2022-05-12: 1000 mL via INTRAVENOUS

## 2022-05-12 MED ORDER — ACETAMINOPHEN 325 MG PO TABS
650.0000 mg | ORAL_TABLET | Freq: Once | ORAL | Status: AC
  Administered 2022-05-12: 650 mg via ORAL
  Filled 2022-05-12: qty 2

## 2022-05-12 MED ORDER — NIRMATRELVIR/RITONAVIR (PAXLOVID) TABLET (RENAL DOSING): 2.0000 | ORAL_TABLET | Freq: Two times a day (BID) | ORAL | 0 refills | Status: AC

## 2022-05-12 NOTE — Discharge Instructions (Addendum)
I have prescribed the Paxlovid antiviral therapy for COVID-19.  This should help limit your symptoms and prevent severe COVID.  It does, however, interact with your Zocor (simvastatin) cholesterol medication.  If you choose to take the Paxlovid, do not take your Zocor while you are taking it and also hold it for 5 days after the Paxlovid is finished (hold your Zocor for a total of 10 days)

## 2022-05-12 NOTE — ED Provider Notes (Signed)
[facility] Provider Note   CSN: [id number] Arrival date & time: 05/11/22  2240     History  Chief Complaint  Patient presents with   Near Syncope    Pt BIB [facility] for syncopal episode. As per ems family found patient on the floor and woke her up by putting orange juice in her mouth. Pt denies head pain at this time. BGS 122.     Kristi Gonzalez is a 77 y.o. female.  Patient comes to the ER for evaluation after syncopal episode.  Patient reports that she has been feeling weak for the last couple of days.  She has not been eating or drinking much.  Today her weakness worsened and she did have a syncopal episode.  She reports that she woke up on the ground, could not get up.  She was on the floor for a couple of hours before family found her.  Patient denies any injury from the fall, but now is starting to develop headache.  No neck or back pain.  No extremity injury.       Home Medications Prior to Admission medications   Medication Sig Start Date End Date Taking? Authorizing Provider  nirmatrelvir/ritonavir, renal dosing, (PAXLOVID) 10 x 150 MG & 10 x '100MG'$  TABS Take 2 tablets by mouth 2 (two) times daily for 5 days. Patient GFR is 53. Take nirmatrelvir (150 mg) one tablet twice daily for 5 days and ritonavir (100 mg) one tablet twice daily for 5 days. 05/12/22 05/17/22 Yes Caedyn Tassinari, Gwenyth Allegra, MD  acetaminophen (TYLENOL) 325 MG tablet Take 2 tablets (650 mg total) by mouth every 6 (six) hours as needed. 04/10/14   Harvie Heck, PA-C  aspirin 81 MG tablet Take 81 mg by mouth daily.    [provider]  benazepril (LOTENSIN) 20 MG tablet Take 1 tablet (20 mg total) by mouth daily. 03/13/21 03/13/22  Shelly Coss, MD  folic acid (FOLVITE) 409 MCG tablet Take 400 mcg by mouth daily.    [provider]  glipiZIDE (GLUCOTROL XL) 10 MG 24 hr tablet Take 10 mg by mouth daily.    [provider]  insulin lispro  (HUMALOG) 100 UNIT/ML KwikPen Inject 10-16 Units into the skin See admin instructions. Taking 10 units with Breakfast and 16 units with lunch and 10 units at dinner.    [provider]  Insulin NPH, Human,, Isophane, (HUMULIN N KWIKPEN) 100 UNIT/ML Kiwkpen Inject 28 Units into the skin daily.    [provider]  simvastatin (ZOCOR) 40 MG tablet Take 40 mg by mouth every evening.    [provider]      Allergies    Canagliflozin and Codeine    Review of Systems   Review of Systems  Physical Exam Updated Vital Signs BP (!) 159/80   Pulse (!) 114   Temp (!) 102.8 F (39.3 C) (Oral)   Resp (!) 22   SpO2 96%  Physical Exam Vitals and nursing note reviewed.  Constitutional:      General: She is not in acute distress.    Appearance: She is well-developed.  HENT:     Head: Normocephalic and atraumatic.     Mouth/Throat:     Mouth: Mucous membranes are moist.  Eyes:     General: Vision grossly intact. Gaze aligned appropriately.     Extraocular Movements: Extraocular movements intact.     Conjunctiva/sclera: Conjunctivae normal.  Cardiovascular:     Rate  and Rhythm: Normal rate and regular rhythm.     Pulses: Normal pulses.     Heart sounds: Normal heart sounds, S1 normal and S2 normal. No murmur heard.    No friction rub. No gallop.  Pulmonary:     Effort: Pulmonary effort is normal. No respiratory distress.     Breath sounds: Normal breath sounds.  Abdominal:     General: Bowel sounds are normal.     Palpations: Abdomen is soft.     Tenderness: There is no abdominal tenderness. There is no guarding or rebound.     Hernia: No hernia is present.  Musculoskeletal:        General: No swelling.     Cervical back: Full passive range of motion without pain, normal range of motion and neck supple. No spinous process tenderness or muscular tenderness. Normal range of motion.     Right lower leg: No edema.     Left lower leg: No edema.  Skin:    General:  Skin is warm and dry.     Capillary Refill: Capillary refill takes less than 2 seconds.     Findings: No ecchymosis, erythema, rash or wound.  Neurological:     General: No focal deficit present.     Mental Status: She is alert and oriented to person, place, and time.     GCS: GCS eye subscore is 4. GCS verbal subscore is 5. GCS motor subscore is 6.     Cranial Nerves: Cranial nerves 2-12 are intact.     Sensory: Sensation is intact.     Motor: Motor function is intact.     Coordination: Coordination is intact.  Psychiatric:        Attention and Perception: Attention normal.        Mood and Affect: Mood normal.        Speech: Speech normal.        Behavior: Behavior normal.     ED Results / Procedures / Treatments   Labs (all labs ordered are listed, but only abnormal results are displayed) Labs Reviewed  RESP PANEL BY RT-PCR (RSV, FLU A&B, COVID)  [id number] - Abnormal; Notable for the following components:      Result Value   SARS Coronavirus 2 by RT PCR POSITIVE (*)    All other components within normal limits  CBC WITH DIFFERENTIAL/PLATELET - Abnormal; Notable for the following components:   Hemoglobin 11.1 (*)    HCT 34.9 (*)    All other components within normal limits  COMPREHENSIVE METABOLIC PANEL - Abnormal; Notable for the following components:   Sodium 132 (*)    Chloride 97 (*)    Glucose, Bld 143 (*)    Creatinine, Ser 1.09 (*)    GFR, Estimated 53 (*)    All other components within normal limits  URINALYSIS, ROUTINE W REFLEX MICROSCOPIC - Abnormal; Notable for the following components:   Ketones, ur 20 (*)    Protein, ur 30 (*)    All other components within normal limits  CK  LACTIC ACID, PLASMA  TROPONIN I (HIGH SENSITIVITY)  TROPONIN I (HIGH SENSITIVITY)    EKG EKG Interpretation  Date/Time:  Thursday May 12 2022 00:29:50 EST Ventricular Rate:  97 PR Interval:  184 QRS Duration: 80 QT Interval:  404 QTC Calculation: 514 R Axis:   19 Text  Interpretation: Sinus rhythm Consider right atrial enlargement Low voltage, precordial leads Borderline T abnormalities, lateral leads Prolonged QT interval No significant change since last  tracing Confirmed by Orpah Greek [phone]) on 05/12/2022 12:37:55 AM  Radiology CT HEAD WO CONTRAST (5MM)  Result Date: 05/12/2022 CLINICAL DATA:  Head trauma. EXAM: CT HEAD WITHOUT CONTRAST TECHNIQUE: Contiguous axial images were obtained from the base of the skull through the vertex without intravenous contrast. RADIATION DOSE REDUCTION: This exam was performed according to the departmental dose-optimization program which includes automated exposure control, adjustment of the mA and/or kV according to patient size and/or use of iterative reconstruction technique. COMPARISON:  03/10/2021. FINDINGS: Brain: No acute intracranial hemorrhage, midline shift or mass effect. No extra-axial fluid collection. Diffuse atrophy is noted. Mild periventricular white matter hypodensities bilaterally. A stable tiny hypodensity is noted in the right cerebellar hemisphere, possible old lacunar infarct. No hydrocephalus. Vascular: No hyperdense vessel or unexpected calcification. Skull: Normal. Negative for fracture or focal lesion. Sinuses/Orbits: Mild mucosal thickening in the right maxillary sinus. No acute orbital abnormality. Other: None. IMPRESSION: 1. No acute intracranial process. 2. Atrophy with chronic microvascular ischemic changes. Electronically Signed   By: Brett Fairy M.D.   On: 05/12/2022 00:35   DG Chest Port 1 View  Result Date: 05/12/2022 CLINICAL DATA:  Fall, syncope EXAM: PORTABLE CHEST 1 VIEW COMPARISON:  03/10/2021 FINDINGS: Lungs are clear.  No pleural effusion or pneumothorax. The heart is top-normal in size. Degenerative changes of the thoracic spine. IMPRESSION: No evidence of acute cardiopulmonary disease. Electronically Signed   By: Julian Hy M.D.   On: 05/12/2022 00:15     Procedures Procedures    Medications Ordered in ED Medications  acetaminophen (TYLENOL) tablet 650 mg (has no administration in time range)  lactated ringers bolus 1,000 mL (1,000 mLs Intravenous New Bag/Given 05/12/22 0050)    ED Course/ Medical Decision Making/ A&P                             Medical Decision Making Amount and/or Complexity of Data Reviewed Independent Historian: caregiver External Data Reviewed: labs, radiology, ECG and notes. Labs: ordered. Decision-making details documented in ED Course. Radiology: ordered and independent interpretation performed. Decision-making details documented in ED Course. ECG/medicine tests: ordered and independent interpretation performed. Decision-making details documented in ED Course.   Patient presents to the emergency department after syncopal episode.  Patient reports that she has not been feeling well for the last 2 or 3 days.  She has had progressively worsening generalized weakness and has had poor oral intake.  There is likely some mild dehydration.  Patient given IV fluids during workup.  Patient had a CT of her head to evaluate for syncopal episode as well as injury from the syncopal episode.  She was complaining of mild headache.  Neurologic exam, however, was normal, nonfocal.  CT head unremarkable.  She was not experiencing any neck pain.  Lab work revealed a white blood cell count of 11.1.  All other values were normal.  This includes a normal urinalysis, no signs of urinary tract infection.  Her COVID swab, however was positive.  This is likely the cause of her generalized weakness and syncopal episode tonight.  While here in the ED she did spike a fever to 102 which is explained by the COVID.  Chest x-ray does not show any evidence of pneumonitis or pneumonia.  Vital signs are otherwise unremarkable.  No hypoxia.  No hypotension.  She will be appropriate for outpatient management with antivirals, symptomatic treatment for  COVID.  Final Clinical Impression(s) / ED Diagnoses Final diagnoses:  [id number]    Rx / DC Orders ED Discharge Orders          Ordered    nirmatrelvir/ritonavir, renal dosing, (PAXLOVID) 10 x 150 MG & 10 x '100MG'$  TABS  2 times daily        05/12/22 0416              Orpah Greek, MD 05/12/22 [id number]

## 2024-02-06 ENCOUNTER — Other Ambulatory Visit: Payer: Self-pay | Admitting: Nephrology

## 2024-02-06 DIAGNOSIS — N1831 Chronic kidney disease, stage 3a: Secondary | ICD-10-CM

## 2024-02-08 ENCOUNTER — Ambulatory Visit
Admission: RE | Admit: 2024-02-08 | Discharge: 2024-02-08 | Disposition: A | Discharge status: Home / Self Care | Source: Ambulatory Visit | Attending: Nephrology | Admitting: Nephrology

## 2024-02-08 DIAGNOSIS — N1831 Chronic kidney disease, stage 3a: Secondary | ICD-10-CM
